# Patient Record
Sex: Female | Born: 1990 | Race: White | Hispanic: No | Smoking: Former smoker
Health system: Southern US, Community
[De-identification: ages and names within clinical notes are randomized; demographics above are authoritative.]

## PROBLEM LIST (undated history)

## (undated) DIAGNOSIS — O24419 Gestational diabetes mellitus in pregnancy, unspecified control: Secondary | ICD-10-CM

## (undated) HISTORY — PX: DILATION AND CURETTAGE OF UTERUS: SHX78

---
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
## 2008-03-27 ENCOUNTER — Emergency Department (HOSPITAL_COMMUNITY): Admission: EM | Admit: 2008-03-27 | Discharge: 2008-03-27 | Payer: Self-pay | Admitting: Emergency Medicine

## 2008-06-27 ENCOUNTER — Emergency Department (HOSPITAL_COMMUNITY): Admission: EM | Admit: 2008-06-27 | Discharge: 2008-06-27 | Payer: Self-pay | Admitting: Emergency Medicine

## 2008-06-29 ENCOUNTER — Emergency Department (HOSPITAL_COMMUNITY): Admission: EM | Admit: 2008-06-29 | Discharge: 2008-06-29 | Payer: Self-pay | Admitting: Emergency Medicine

## 2010-04-02 ENCOUNTER — Other Ambulatory Visit: Admission: RE | Admit: 2010-04-02 | Discharge: 2010-04-02 | Payer: Self-pay | Admitting: Obstetrics & Gynecology

## 2010-09-14 ENCOUNTER — Emergency Department (HOSPITAL_COMMUNITY): Admission: EM | Admit: 2010-09-14 | Discharge: 2010-09-14 | Payer: Self-pay | Admitting: Emergency Medicine

## 2011-05-16 NOTE — H&P (Signed)
Emily [name], Emily [name]               ACCOUNT NO.:  [id number]   MEDICAL RECORD NO.:  [id number]          PATIENT TYPE:  INP   LOCATION:  LDR1                          FACILITY:  [facility]   PHYSICIAN:  Lazaro Arms, M.D.   DATE OF BIRTH:  [birth year]   DATE OF ADMISSION:  01/23/2007  DATE OF DISCHARGE:  LH                              HISTORY & PHYSICAL   Nira is a 20 year old white female gravida 2, para 0, aborts 1.  Estimated date of delivery of February 06, 2007 by 7-week, 5 day  sonogram, currently at [redacted] weeks gestation presented to labor and  delivery complaining of regular uterine contractions.  She was found to  be having contractions every 4-6 minutes.  Her cervix is 1-2 cm,pretty  thick and minus 2.  We kept her here in observation.  Gave her 15 of  morphine IM as she was crying and in a great deal of pain.  Contractions  spaced out.  Not getting any closer together.  This morning, she is 2-3,  15, minus 2 with an amniotomy.  She is in early labor.  She will need  pitocin augmentation and also an epidural.  She has already had a couple  of dose of IV pain medicine.   PAST MEDICAL HISTORY:  Negative.   PAST SURGICAL HISTORY:  D&C in September, 2005 for pregnancy loss.   ALLERGIES:  None.   MEDICATIONS:  Prenatal vitamins.   REVIEW OF SYSTEMS:  Otherwise, negative.  She A positive, varicella  immune, rubella immune, antibody screen is negative, hepatitis B  negative, HIV non-reactive.  HSV2 is negative.  Serology is non-  reactive.  Pap smear showed ASCUS with HPV being indeterminate.  GC and  Chlamydia were negative x2, group B strep negative, Glucola was 143 but  I do not see her 3-hour GTT on the chart.  __________ was normal.   IMPRESSION:  1. Intrauterine pregnancy [redacted] weeks gestation.  2. Early labor.   PLAN:  The patient has undergone an amniotomy to augment her labor with  pitocin and place an epidural.      Lazaro Arms, M.D.  Electronically  Signed     LHE/MEDQ  D:  01/24/2007  T:  01/24/2007  Job:  [id number]

## 2011-05-16 NOTE — Group Therapy Note (Signed)
[name], [name]               ACCOUNT NO.:  [id number]   MEDICAL RECORD NO.:  [id number]          PATIENT TYPE:  OBV   LOCATION:  A415                          FACILITY:  [facility]   PHYSICIAN:  Lazaro Arms, M.D.   DATE OF BIRTH:  20-Mar-1991   DATE OF PROCEDURE:  DATE OF DISCHARGE:  01/08/2007                                 PROGRESS NOTE   PROGRESS NOTE   Emily [name] was in the birthing center 01/07/2007. She was admitted  for management of dehydration and vomiting.  She received several liters  of IV fluids and is going to be kept overnight.      Jacklyn Shell, C.N.M.      Lazaro Arms, M.D.  Electronically Signed    FC/MEDQ  D:  01/12/2007  T:  01/12/2007  Job:  [id number]

## 2011-05-16 NOTE — H&P (Signed)
NAME:  Emily [name], Emily [name]                         ACCOUNT NO.:  [id number]   MEDICAL RECORD NO.:  [id number]                   PATIENT TYPE:   LOCATION:                                       FACILITY:  [facility]   PHYSICIAN:  Lazaro Arms, M.D.                DATE OF BIRTH:  14-May-1991   DATE OF ADMISSION:  09/05/2004  DATE OF DISCHARGE:                                HISTORY & PHYSICAL   HISTORY OF PRESENT ILLNESS:  Emily [name] is a 20 year old white female, gravida  1, para 0, last menstrual period unsure, but she came in the office and had  a positive pregnancy test.  She had a vaginal ultrasound that revealed a  nonviable pregnancy at [redacted] weeks gestation.  The patient and her mother were  informed of this.  It was confirmed by independently from [place].  The  patient came back and wanted to have another confirmation.  I sent her to  the hospital and again they confirmed a 9-week gestation that was nonviable.  As a result, the patient and her mother have requested a D&C and she is  admitted for that.   PAST MEDICAL HISTORY:  Negative.   PAST SURGICAL HISTORY:  Negative.   PAST OBSTETRICAL HISTORY:  Nulliparous.   ALLERGIES:  None.   MEDICATIONS:  Multivitamins.   REVIEW OF SYSTEMS:  Otherwise negative.   PHYSICAL EXAMINATION:  HEENT:  Unremarkable.  NECK:  Thyroid normal.  LUNGS:  Clear.  HEART:  Regular rate and rhythm without murmur, rub or gallop.  BREASTS:  Without mass, discharge or skin changes.  ABDOMEN:  Benign.  PELVIC:  Normal.  EXTREMITIES:  Warm with no edema.   IMPRESSION:  Miss abortion in the first trimester.   PLAN:  The patient is admitted for cervical dilation and uterine curettage.  She understands the risks, benefits, indications and alternatives.  Will  proceed.    ___________________________________________                                         Lazaro Arms, M.D.   Loraine Maple  D:  09/04/2004  T:  09/04/2004  Job:  [id number]

## 2011-05-16 NOTE — Op Note (Signed)
[name], [name]               ACCOUNT NO.:  [id number]   MEDICAL RECORD NO.:  [id number]          PATIENT TYPE:  INP   LOCATION:  LDR1                          FACILITY:  [facility]   PHYSICIAN:  Lazaro Arms, M.D.   DATE OF BIRTH:  07/02/1991   DATE OF PROCEDURE:  01/24/2007  DATE OF DISCHARGE:                               OPERATIVE REPORT   PROCEDURE:  Epidural note.   HISTORY:  [name] is a 20 year old gravida 2, para 0, abortion 1-- [redacted]  weeks gestation in early labor, who has had a couple doses of IV pain  medicine and is requesting an epidural to be placed.   DESCRIPTION OF PROCEDURE:  The Patient is placed in the sitting  position.  Betadine prep was used; 1% lidocaine was injected in the L3-  L4 interspace.  The area was then field draped, and a 17-gauge Tuohy  needle was used; loss of resistance technique employed.  The epidural  space was found with one pass without difficulty.  Then 10 cc of 0.125%  bupivacaine plain was given as a test dose without ill effects.  The  epidural catheter was then fed, but it feeds directly into a vessel.  I  cleared the blood.  I took the catheter out, and then threaded it once  again; this time it threaded fine into the epidural space and not into a  vessel.  I kept putting negative pressure to make sure no blood was  drawn, as I injected the dosing of the epidural of 10 cc of 0.125%  bupivacaine -- but no further blood loss was gotten back and the patient  was asymptomatic.  It was taped down 5 cm from the epidural space.  Continuous infusion of 0.125% bupivacaine plain with 2 mcg/cc of  fentanyl was begun at 12 cc/hr.  The patient is getting good pain  relief.  Blood pressure stable.  Fetal heart rate tracing stable.      Lazaro Arms, M.D.  Electronically Signed     LHE/MEDQ  D:  01/24/2007  T:  01/24/2007  Job:  [id number]

## 2011-05-16 NOTE — Discharge Summary (Signed)
[name], Emily [name]               ACCOUNT NO.:  [id number]   MEDICAL RECORD NO.:  [id number]          PATIENT TYPE:  OIB   LOCATION:  A415                          FACILITY:  [facility]   PHYSICIAN:  Tilda Burrow, M.D. DATE OF BIRTH:  [birth year]   DATE OF ADMISSION:  01/07/2007  DATE OF DISCHARGE:  LH                               DISCHARGE SUMMARY   OBSERVATION NOTE:  Observation times 16 hours.   The patient is a 20 year old female, gravida 2, para 0, AB 1, presents  complaining of abdominal and back discomfort with low-grade fever.  She  has been throwing up more than usual.  She describes herself as having  been vomiting the whole pregnancy, but it is worse in the last few  days.  There has been a virus going through the family in both her  sister and her step-dad, who are nauseated and vomiting at home with low-  grade temperatures.  She presents today with temperature of 99.1 with  laboratory evaluation minimal.  She has normal pulse and blood pressure.  Fetal heart rate is in the normal range.  There are no contractions.  She does complain of abdominal discomfort.  She is observed through the  night.   This a.m., abdominal exam is entirely normal with a gravid uterus, term-  sized fetus.  Cervix not checked at this time.  Normal at last visit,  early this week.   PLAN:  Plans are to test on full liquids this morning and discharge her  home if this is tolerated and give her prescriptions for Phenergan 25 mg  p.o. q. 6 hours p.r.n. nausea, as well as Vicodin for her chronic back  discomfort of pregnancy and follow up next week.   DISCHARGE DIAGNOSES:  1. Pregnancy, 36 weeks, not delivered.  2. Nausea and vomiting secondary to gastroenteritis, suspect viral.      Tilda Burrow, M.D.  Electronically Signed     JVF/MEDQ  D:  01/08/2007  T:  01/08/2007  Job:  [id number]   cc:   [facility]

## 2011-05-16 NOTE — Op Note (Signed)
NAMEJAYCELYNN, Emily [name]               ACCOUNT NO.:  [id number]   MEDICAL RECORD NO.:  [id number]          PATIENT TYPE:  INP   LOCATION:  LDR1                          FACILITY:  [facility]   PHYSICIAN:  Lazaro Arms, M.D.   DATE OF BIRTH:  1991-11-07   DATE OF PROCEDURE:  01/24/2007  DATE OF DISCHARGE:                                PROCEDURE NOTE   DELIVERY NOTE:  Emily [name] is an 20 year old gravida 2, para 0, abortus 1  at 83 weeks' gestation, who presented in latent-phase labor.  I ended up  doing an amniotomy and augmenting her labor and placing an epidural.  She had a slow progression through the latent phase and a pretty  appropriate progression through the first stage of labor.  She was found  to be complete at 2120 with a strong urge to push.  Fetal heart rate  tracing had been reassuring throughout.   Over an intact perineum was delivered a viable female infant at 2156 with  Apgars of 8 and 9, weighing 6 pounds 12.3 ounces.  There was a 3-vessel  cord.  Cord blood and cord gas sent.  The infant's arms were both  compound up next to the shoulders and as such, required a little extra  time to actually flipped the arms out and this was done without  difficulty.  The infant underwent routine neonatal resuscitation.  The  placenta was delivered spontaneously at time of 2210.  The uterus was  firm and 3 fingerbreadths below the umbilicus.  Estimated blood loss for  delivery was 250 mL.  The epidural catheter was removed, blue tip  intact, and she is going to undergo routine postpartum care.  The infant  will undergo routine neonatal care.      Lazaro Arms, M.D.  Electronically Signed     LHE/MEDQ  D:  01/24/2007  T:  01/25/2007  Job:  [id number]

## 2011-05-16 NOTE — H&P (Signed)
Emily [name], Emily [name]               ACCOUNT NO.:  [id number]   MEDICAL RECORD NO.:  [id number]          PATIENT TYPE:  INP   LOCATION:  A417                          FACILITY:  [facility]   PHYSICIAN:  Tilda Burrow, M.D. DATE OF BIRTH:  1991-04-29   DATE OF ADMISSION:  10/18/2006  DATE OF DISCHARGE:  LH                                HISTORY & PHYSICAL   ADMITTING DIAGNOSES:  Right pyelonephritis.   HPI:  This 20 year old, gravida 2, para 1, due February 8 placing her at 5  months' gestation is admitted via the emergency room after a 5-day history  of a mild right-sided pain progressing and with development of fever x24  hours.  She presented to the emergency room with a temperature 101, dropping  to 97.9 with fluid hydration.  She was found to have a positive urinalysis  with TNTC white cells and bacteria.  She has hemoglobin 10, hematocrit 29.6,  white count 12,900.  She is tolerating food, not throwing up at this time  but has right-sided pain not completely relieved with Lortab 5 every 6  hours.   PAST MEDICAL HISTORY:  Benign surgical history of D and C for miscarriage.   ALLERGIES:  None known.   Upon ER arrival, temperature 101.3, pulse 118, respirations 20; dropping to  97.9, pulse 81, respiration of 20, blood pressure 116/63.  Weight 125.  Height 5 feet 2 inches at admission at 9 a.m.  Dr. _eure__ was consulted  regarding admission last night.   PHYSICAL EXAMINATION:  Notable for 1-2+ right CVA tenderness.  Abdomen was  nontender without guarding or rebound.  Gravid uterus, 5 months' size.  However, review of her prenatal record from the office shows that she  started her pregnancy with a hemoglobin 5.6, hematocrit 37.7, blood type A-  positive.  Hepatitis, HIV, RPR, GC, and Chlamydia all negative.  Pap smear  showing ASCUS.   PLAN:  Continue Rocephin 1 gram IV every 12 hours.  Await culture and  sensitivity for brief hospital stay.      Tilda Burrow, M.D.  Electronically Signed     JVF/MEDQ  D:  10/19/2006  T:  10/19/2006  Job:  [id number]

## 2011-05-16 NOTE — Op Note (Signed)
NAME:  Emily [name], Emily [name]                         ACCOUNT NO.:  [id number]   MEDICAL RECORD NO.:  [id number]                   PATIENT TYPE:  AMB   LOCATION:  DAY                                  FACILITY:  [facility]   PHYSICIAN:  Lazaro Arms, M.D.                DATE OF BIRTH:  August 02, 1991   DATE OF PROCEDURE:  09/05/2004  DATE OF DISCHARGE:                                 OPERATIVE REPORT   PREOPERATIVE DIAGNOSIS:  Missed abortion at 9 weeks.   POSTOPERATIVE DIAGNOSIS:  Missed abortion at 9 weeks.   PROCEDURE:  Cervical dilation and sharp and suction uterine curettage.   SURGEON:  Dr. Despina Hidden.   ANESTHESIA:  1.  Laryngeal mask airway.  2.  Paracervical block.   FINDINGS:  The patient had confirmed by three different sources missed AB at  9 weeks, and she requested to have a D&C performed. Her mother was in  attendance for all conversations and ultrasounds and signed the permit.   DESCRIPTION OF OPERATION:  The patient was taken to the operating room,  placed in supine position. Underwent laryngeal mask airway, placed in a  dorsi lithotomy position, prepped and draped. Bladder was drained. The  cervix was grasped. Paracervical block was placed. Using 1% lidocaine 10 cc  on each side, the cervix was dilated serially to allow passage of 8 curved  suction curet. Several passes were made. All tissue removed. Ron Agee sharp  curettage was performed and confirmed there was good uterine cry in all  areas, and one more pass with the suction curet, and all tissue had been  removed. The patient was awakened from anesthesia, taken to recovery room in  good and stable condition. All counts were correct. She received Ancef and  Toradol prophylactically, and she received Methergine intraoperatively.      ___________________________________________                                            Lazaro Arms, M.D.   Loraine Maple  D:  09/05/2004  T:  09/05/2004  Job:  [id number]

## 2011-05-16 NOTE — Discharge Summary (Signed)
NAMESHANTAL, Emily [name]               ACCOUNT NO.:  [id number]   MEDICAL RECORD NO.:  [id number]          PATIENT TYPE:  INP   LOCATION:  A417                          FACILITY:  [facility]   PHYSICIAN:  Tilda Burrow, M.D. DATE OF BIRTH:  November 16, 1991   DATE OF ADMISSION:  10/18/2006  DATE OF DISCHARGE:  10/25/2007LH                                 DISCHARGE SUMMARY   ADMITTING DIAGNOSES:  Right pyelonephritis.   DISCHARGE DIAGNOSES:  Right pyelonephritis secondary to E. coli; sensitive  to cephalosporin.   HOSPITAL SUMMARY:  This 20 year old gravida 2 para 1 at 5 months gestation  was admitted after presenting with a several-day history of low-grade  discomfort, temperature to 101 upon admission to the emergency room  reported.  Urinalysis positive; white count 12,900.  See HPI for details.   HOSPITAL COURSE:  The patient was admitted and placed on Rocephin 1 gram IV.  She had a slow response to antibiotics.  She remained low-grade temperature  at 99 range her first postoperative day; 100.1 the second postoperative day.  She was switched to cephalosporin to increase the frequency administration.  Culture returned showing sensitivity to all antibiotics, including Rocephine  and Keflex.   She was afebrile on October 21, 2006 for 24 hr; felt subjectively better and  was discharged home on October 22, 2006.   DISCHARGE MEDICATIONS:  1. Keflex 500 mg t.i.d. x10 days.  2. Loritab 5/500 30 tablets one q.4 h. p.r.n. pain.  3. Phenergan 12.5 mg tablet; 6 tablets as needed for occasional nausea.      Tilda Burrow, M.D.  Electronically Signed     JVF/MEDQ  D:  10/22/2006  T:  10/23/2006  Job:  [id number]

## 2011-07-16 ENCOUNTER — Encounter: Payer: Self-pay | Admitting: *Deleted

## 2011-07-16 ENCOUNTER — Emergency Department (HOSPITAL_COMMUNITY)
Admission: EM | Admit: 2011-07-16 | Discharge: 2011-07-16 | Disposition: A | Discharge status: Home / Self Care | Payer: Self-pay | Attending: Emergency Medicine | Admitting: Emergency Medicine

## 2011-07-16 DIAGNOSIS — F17200 Nicotine dependence, unspecified, uncomplicated: Secondary | ICD-10-CM | POA: Insufficient documentation

## 2011-07-16 DIAGNOSIS — R05 Cough: Secondary | ICD-10-CM

## 2011-07-16 DIAGNOSIS — R070 Pain in throat: Secondary | ICD-10-CM | POA: Insufficient documentation

## 2011-07-16 DIAGNOSIS — R059 Cough, unspecified: Secondary | ICD-10-CM | POA: Insufficient documentation

## 2011-07-16 DIAGNOSIS — J069 Acute upper respiratory infection, unspecified: Secondary | ICD-10-CM | POA: Insufficient documentation

## 2011-07-16 DIAGNOSIS — R509 Fever, unspecified: Secondary | ICD-10-CM | POA: Insufficient documentation

## 2011-07-16 MED ORDER — GUAIFENESIN-CODEINE 100-10 MG/5ML PO SYRP: ORAL_SOLUTION | ORAL | Status: DC

## 2011-07-16 MED ORDER — AZITHROMYCIN 250 MG PO TABS: ORAL_TABLET | ORAL | Status: DC

## 2011-07-16 NOTE — ED Notes (Signed)
Pt c/o sore throat, cough and congestion x 1 week. Pt states that she is coughing up blood tinged yellow mucous.

## 2011-07-16 NOTE — ED Provider Notes (Signed)
History     Chief Complaint  Patient presents with  . URI   HPI Comments: Patient c/o occasionally productive cough with chills and fever intermittently for one week.  She also reports sore throat .  She denies neck pain, or stiffness, headaches, chest pain, vomiting, shortness of breath or abd pain  Patient is a 20 y.o. female presenting with URI. The history is provided by the patient and the spouse.  URI The primary symptoms include fever, sore throat and cough. Primary symptoms do not include fatigue, headaches, ear pain, swollen glands, wheezing, abdominal pain, nausea, vomiting or rash. The current episode started 6 to 7 days ago. This is a new problem. The problem has not changed since onset. The fever began 6 to 7 days ago. The fever has been gradually improving since its onset. The maximum temperature recorded prior to her arrival was unknown.  The sore throat began more than 2 days ago. The sore throat has been unchanged since its onset. The sore throat is mild in intensity. Pain radiation: none. The sore throat is not accompanied by trouble swallowing or drooling.  The cough began 6 to 7 days ago. The cough is new. The cough is productive. There is nondescript sputum produced.  The onset of the illness is associated with exposure to sick contacts. Symptoms associated with the illness include chills and congestion. The illness is not associated with facial pain, sinus pressure or rhinorrhea. The following treatments were addressed: Acetaminophen was ineffective. A decongestant was not tried. NSAIDs were not tried.    History reviewed. No pertinent past medical history.  History reviewed. No pertinent past surgical history.  History reviewed. No pertinent family history.  History  Substance Use Topics  . Smoking status: Current Everyday Smoker -- 1.0 packs/day    Types: Cigarettes  . Smokeless tobacco: Not on file  . Alcohol Use: Yes     occasionally liquor    OB History    Grav Para Term Preterm Abortions TAB SAB Ect Mult Living                  Review of Systems  Constitutional: Positive for fever and chills. Negative for fatigue and unexpected weight change.  HENT: Positive for congestion and sore throat. Negative for ear pain, rhinorrhea, drooling, trouble swallowing, neck pain, neck stiffness and sinus pressure.   Eyes: Negative for pain and itching.  Respiratory: Positive for cough. Negative for chest tightness, shortness of breath and wheezing.   Cardiovascular: Negative.   Gastrointestinal: Negative.  Negative for nausea, vomiting and abdominal pain.  Genitourinary: Negative for dysuria and flank pain.  Musculoskeletal: Negative for back pain.  Skin: Negative.  Negative for rash.  Neurological: Negative for weakness, numbness and headaches.  Hematological: Does not bruise/bleed easily.  Psychiatric/Behavioral: Negative.     Physical Exam  BP 126/74  Pulse 87  Temp(Src) 98.9 F (37.2 C) (Oral)  Resp 24  Ht 5\' 1"  (1.549 m)  Wt 125 lb (56.7 kg)  BMI 23.62 kg/m2  SpO2 98%  LMP 03/28/2011  Physical Exam  Constitutional: She is oriented to person, place, and time. She appears well-developed and well-nourished. No distress.  HENT:  Head: Normocephalic and atraumatic.  Right Ear: External ear normal.  Left Ear: External ear normal.  Eyes: EOM are normal. Pupils are equal, round, and reactive to light.  Neck: Normal range of motion. Neck supple. No tracheal deviation present.  Cardiovascular: Normal rate, regular rhythm and normal heart sounds.  Pulmonary/Chest: No respiratory distress. She has no wheezes. She has no rales. She exhibits no tenderness.       cough  Abdominal: Soft. There is no tenderness.  Musculoskeletal: Normal range of motion.  Lymphadenopathy:    She has no cervical adenopathy.  Neurological: She is alert and oriented to person, place, and time.  Skin: Skin is warm and dry.  Psychiatric: She has a normal mood and  affect.    ED Course  Procedures  MDM  Patient is alert, no meningeal signs, does not appear septic.  Vitals are stable.     Medical screening examination/treatment/procedure(s) were performed by non-physician practitioner and as supervising physician I was immediately available for consultation/collaboration.    Tammy L. [place], [place] 07/16/11 1515  Sunnie Nielsen, MD 07/19/11 [phone]

## 2011-07-16 NOTE — ED Notes (Signed)
Cold symptoms x 1 week,

## 2011-09-25 LAB — STREP A DNA PROBE

## 2011-09-25 LAB — RAPID STREP SCREEN (MED CTR MEBANE ONLY): Streptococcus, Group A Screen (Direct): NEGATIVE

## 2012-04-27 ENCOUNTER — Encounter (HOSPITAL_COMMUNITY): Payer: Self-pay

## 2012-04-27 ENCOUNTER — Emergency Department (HOSPITAL_COMMUNITY)
Admission: EM | Admit: 2012-04-27 | Discharge: 2012-04-27 | Disposition: A | Discharge status: Home / Self Care | Payer: Self-pay | Attending: Emergency Medicine | Admitting: Emergency Medicine

## 2012-04-27 DIAGNOSIS — R51 Headache: Secondary | ICD-10-CM | POA: Insufficient documentation

## 2012-04-27 DIAGNOSIS — R110 Nausea: Secondary | ICD-10-CM | POA: Insufficient documentation

## 2012-04-27 MED ORDER — TRAMADOL HCL 50 MG PO TABS: 50.0000 mg | ORAL_TABLET | Freq: Four times a day (QID) | ORAL | Status: AC | PRN

## 2012-04-27 MED ORDER — KETOROLAC TROMETHAMINE 30 MG/ML IJ SOLN
15.0000 mg | Freq: Once | INTRAMUSCULAR | Status: AC
  Administered 2012-04-27: 15 mg via INTRAVENOUS
  Filled 2012-04-27: qty 1

## 2012-04-27 MED ORDER — SODIUM CHLORIDE 0.9 % IV BOLUS (SEPSIS)
1000.0000 mL | Freq: Once | INTRAVENOUS | Status: AC
  Administered 2012-04-27: 1000 mL via INTRAVENOUS

## 2012-04-27 MED ORDER — METOCLOPRAMIDE HCL 5 MG/ML IJ SOLN
10.0000 mg | Freq: Once | INTRAMUSCULAR | Status: AC
  Administered 2012-04-27: 10 mg via INTRAVENOUS
  Filled 2012-04-27: qty 2

## 2012-04-27 MED ORDER — DIPHENHYDRAMINE HCL 50 MG/ML IJ SOLN
25.0000 mg | Freq: Once | INTRAMUSCULAR | Status: AC
  Administered 2012-04-27: 25 mg via INTRAVENOUS
  Filled 2012-04-27: qty 1

## 2012-04-27 NOTE — ED Notes (Signed)
Patient with no complaints at this time. Respirations even and unlabored. Skin warm/dry. Discharge instructions reviewed with patient at this time. Patient given opportunity to voice concerns/ask questions. IV removed per policy and band-aid applied to site. Patient discharged at this time and left Emergency Department with steady gait.  

## 2012-04-27 NOTE — ED Provider Notes (Signed)
History    21 year old female with headache. Gradual onset about 3 days ago. Denies,. Pain is relatively constant. Diffuse. Does not lateralize. Pain seems to be worse in the morning some slight improvement throughout the day. Nausea today. No vomiting. No acute visual changes. No fevers or chills. No neck pain or neck stiffness. Denies use of blood thinning medication aside from the Excedrin Migraine that she has been taking with little relief. No numbness, tingling or loss of strength. Feels steady when she walks. No family history of cerebral aneurysm that she is aware of.   CSN: [id number]  Arrival date & time 04/27/12  1424   First MD Initiated Contact with Patient 04/27/12 1433      Chief Complaint  Patient presents with  . Headache    (Consider location/radiation/quality/duration/timing/severity/associated sxs/prior treatment) HPI  History reviewed. No pertinent past medical history.  History reviewed. No pertinent past surgical history.  No family history on file.  History  Substance Use Topics  . Smoking status: Current Everyday Smoker -- 1.0 packs/day    Types: Cigarettes  . Smokeless tobacco: Not on file  . Alcohol Use: No    OB History    Grav Para Term Preterm Abortions TAB SAB Ect Mult Living                  Review of Systems   Review of symptoms negative unless otherwise noted in HPI.   Allergies  Review of patient's allergies indicates no known allergies.  Home Medications   Current Outpatient Rx  Name Route Sig Dispense Refill  . ACETAMINOPHEN 500 MG PO TABS Oral Take 1,000 mg by mouth once as needed. For pain    . ASPIRIN-ACETAMINOPHEN-CAFFEINE 250-250-65 MG PO TABS Oral Take 2 tablets by mouth once as needed. For pain    . ASPIRIN-ACETAMINOPHEN-CAFFEINE 250-250-65 MG PO TABS Oral Take 2 tablets by mouth once as needed. For headache pain    . ETONOGESTREL 68 MG [place] IMPL Subcutaneous Inject 1 each into the skin once.      BP 124/62  Pulse 82   Temp(Src) 98.1 F (36.7 C) (Oral)  Resp 18  Ht 5\' 1"  (1.549 m)  Wt 106 lb (48.081 kg)  BMI 20.03 kg/m2  SpO2 99%  Physical Exam  Nursing note and vitals reviewed. Constitutional: She is oriented to person, place, and time. She appears well-developed and well-nourished. No distress.  HENT:  Head: Normocephalic and atraumatic.  Right Ear: External ear normal.  Left Ear: External ear normal.  Mouth/Throat: Oropharynx is clear and moist.       No external signs of trauma. Head and neck and face normal to inspection. No tenderness. No concerning skin changes.  Eyes: Conjunctivae and EOM are normal. Pupils are equal, round, and reactive to light. Right eye exhibits no discharge. Left eye exhibits no discharge.  Neck: Normal range of motion. Neck supple.  Cardiovascular: Normal rate, regular rhythm and normal heart sounds.  Exam reveals no gallop and no friction rub.   No murmur heard. Pulmonary/Chest: Effort normal and breath sounds normal. No respiratory distress.  Abdominal: Soft. She exhibits no distension. There is no tenderness.  Musculoskeletal: She exhibits no edema and no tenderness.  Lymphadenopathy:    She has no cervical adenopathy.  Neurological: She is alert and oriented to person, place, and time. No cranial nerve deficit. She exhibits normal muscle tone. Coordination normal.       Good finger to nose and heel to shin testing bilaterally. Good  rapid alternating finger movements.  Skin: Skin is warm and dry. She is not diaphoretic.  Psychiatric: She has a normal mood and affect. Her behavior is normal. Thought content normal.    ED Course  Procedures (including critical care time)  Labs Reviewed - No data to display No results found.   1. Headache       MDM  21 year old female with headache. Suspect primary HA. Consider emergent secondary causes such as bleed, infectious or mass but doubt. There is no history of trauma. Pt has a nonfocal neurological exam. Afebrile  and neck supple. No use of blood thinning medication. Consider ocular etiology such as acute angle closure glaucoma but doubt. Pt denies acute change in visual acuity and eye exam unremarkable.  Doubt CO poisoning. No contacts with similar symptoms. Doubt venous thrombosis. Doubt carotid or vertebral arteries dissection. Symptoms improved with meds. Feel that can be safely discharged, but strict return precautions discussed. Outpt fu.         Raeford Razor, MD 04/29/12 1436

## 2012-04-27 NOTE — ED Notes (Signed)
Headache x last 3 days, pain worse in the mornings, +nausea today.  "very shaky at times"

## 2012-04-27 NOTE — Discharge Instructions (Signed)
Headaches, Frequently Asked Questions MIGRAINE HEADACHES Q: What is migraine? What causes it? How can I treat it? A: Generally, migraine headaches begin as a dull ache. Then they develop into a constant, throbbing, and pulsating pain. You may experience pain at the temples. You may experience pain at the front or back of one or both sides of the head. The pain is usually accompanied by a combination of:  Nausea.   Vomiting.   Sensitivity to light and noise.  Some people (about 15%) experience an aura (see below) before an attack. The cause of migraine is believed to be chemical reactions in the brain. Treatment for migraine may include over-the-counter or prescription medications. It may also include self-help techniques. These include relaxation training and biofeedback.  Q: What is an aura? A: About 15% of people with migraine get an "aura". This is a sign of neurological symptoms that occur before a migraine headache. You may see wavy or jagged lines, dots, or flashing lights. You might experience tunnel vision or blind spots in one or both eyes. The aura can include visual or auditory hallucinations (something imagined). It may include disruptions in smell (such as strange odors), taste or touch. Other symptoms include:  Numbness.   A "pins and needles" sensation.   Difficulty in recalling or speaking the correct word.  These neurological events may last as long as 60 minutes. These symptoms will fade as the headache begins. Q: What is a trigger? A: Certain physical or environmental factors can lead to or "trigger" a migraine. These include:  Foods.   Hormonal changes.   Weather.   Stress.  It is important to remember that triggers are different for everyone. To help prevent migraine attacks, you need to figure out which triggers affect you. Keep a headache diary. This is a good way to track triggers. The diary will help you talk to your healthcare professional about your  condition. Q: Does weather affect migraines? A: Bright sunshine, hot, humid conditions, and drastic changes in barometric pressure may lead to, or "trigger," a migraine attack in some people. But studies have Revuelta that weather does not act as a trigger for everyone with migraines. Q: What is the link between migraine and hormones? A: Hormones start and regulate many of your body's functions. Hormones keep your body in balance within a constantly changing environment. The levels of hormones in your body are unbalanced at times. Examples are during menstruation, pregnancy, or menopause. That can lead to a migraine attack. In fact, about three quarters of all women with migraine report that their attacks are related to the menstrual cycle.  Q: Is there an increased risk of stroke for migraine sufferers? A: The likelihood of a migraine attack causing a stroke is very remote. That is not to say that migraine sufferers cannot have a stroke associated with their migraines. In persons under age 40, the most common associated factor for stroke is migraine headache. But over the course of a person's normal life span, the occurrence of migraine headache may actually be associated with a reduced risk of dying from cerebrovascular disease due to stroke.  Q: What are acute medications for migraine? A: Acute medications are used to treat the pain of the headache after it has started. Examples over-the-counter medications, NSAIDs, ergots, and triptans.  Q: What are the triptans? A: Triptans are the newest class of abortive medications. They are specifically targeted to treat migraine. Triptans are vasoconstrictors. They moderate some chemical reactions in the brain.   The triptans work on receptors in your brain. Triptans help to restore the balance of a neurotransmitter called serotonin. Fluctuations in levels of serotonin are thought to be a main cause of migraine.  Q: Are over-the-counter medications for migraine  effective? A: Over-the-counter, or "OTC," medications may be effective in relieving mild to moderate pain and associated symptoms of migraine. But you should see your caregiver before beginning any treatment regimen for migraine.  Q: What are preventive medications for migraine? A: Preventive medications for migraine are sometimes referred to as "prophylactic" treatments. They are used to reduce the frequency, severity, and length of migraine attacks. Examples of preventive medications include antiepileptic medications, antidepressants, beta-blockers, calcium channel blockers, and NSAIDs (nonsteroidal anti-inflammatory drugs). Q: Why are anticonvulsants used to treat migraine? A: During the past few years, there has been an increased interest in antiepileptic drugs for the prevention of migraine. They are sometimes referred to as "anticonvulsants". Both epilepsy and migraine may be caused by similar reactions in the brain.  Q: Why are antidepressants used to treat migraine? A: Antidepressants are typically used to treat people with depression. They may reduce migraine frequency by regulating chemical levels, such as serotonin, in the brain.  Q: What alternative therapies are used to treat migraine? A: The term "alternative therapies" is often used to describe treatments considered outside the scope of conventional Western medicine. Examples of alternative therapy include acupuncture, acupressure, and yoga. Another common alternative treatment is herbal therapy. Some herbs are believed to relieve headache pain. Always discuss alternative therapies with your caregiver before proceeding. Some herbal products contain arsenic and other toxins. TENSION HEADACHES Q: What is a tension-type headache? What causes it? How can I treat it? A: Tension-type headaches occur randomly. They are often the result of temporary stress, anxiety, fatigue, or anger. Symptoms include soreness in your temples, a tightening  band-like sensation around your head (a "vice-like" ache). Symptoms can also include a pulling feeling, pressure sensations, and contracting head and neck muscles. The headache begins in your forehead, temples, or the back of your head and neck. Treatment for tension-type headache may include over-the-counter or prescription medications. Treatment may also include self-help techniques such as relaxation training and biofeedback. CLUSTER HEADACHES Q: What is a cluster headache? What causes it? How can I treat it? A: Cluster headache gets its name because the attacks come in groups. The pain arrives with little, if any, warning. It is usually on one side of the head. A tearing or bloodshot eye and a runny nose on the same side of the headache may also accompany the pain. Cluster headaches are believed to be caused by chemical reactions in the brain. They have been described as the most severe and intense of any headache type. Treatment for cluster headache includes prescription medication and oxygen. SINUS HEADACHES Q: What is a sinus headache? What causes it? How can I treat it? A: When a cavity in the bones of the face and skull (a sinus) becomes inflamed, the inflammation will cause localized pain. This condition is usually the result of an allergic reaction, a tumor, or an infection. If your headache is caused by a sinus blockage, such as an infection, you will probably have a fever. An x-ray will confirm a sinus blockage. Your caregiver's treatment might include antibiotics for the infection, as well as antihistamines or decongestants.  REBOUND HEADACHES Q: What is a rebound headache? What causes it? How can I treat it? A: A pattern of taking acute headache medications too   often can lead to a condition known as "rebound headache." A pattern of taking too much headache medication includes taking it more than 2 days per week or in excessive amounts. That means more than the label or a caregiver advises.  With rebound headaches, your medications not only stop relieving pain, they actually begin to cause headaches. Doctors treat rebound headache by tapering the medication that is being overused. Sometimes your caregiver will gradually substitute a different type of treatment or medication. Stopping may be a challenge. Regularly overusing a medication increases the potential for serious side effects. Consult a caregiver if you regularly use headache medications more than 2 days per week or more than the label advises. ADDITIONAL QUESTIONS AND ANSWERS Q: What is biofeedback? A: Biofeedback is a self-help treatment. Biofeedback uses special equipment to monitor your body's involuntary physical responses. Biofeedback monitors:  Breathing.   Pulse.   Heart rate.   Temperature.   Muscle tension.   Brain activity.  Biofeedback helps you refine and perfect your relaxation exercises. You learn to control the physical responses that are related to stress. Once the technique has been mastered, you do not need the equipment any more. Q: Are headaches hereditary? A: Four out of five (80%) of people that suffer report a family history of migraine. Scientists are not sure if this is genetic or a family predisposition. Despite the uncertainty, a child has a 50% chance of having migraine if one parent suffers. The child has a 75% chance if both parents suffer.  Q: Can children get headaches? A: By the time they reach high school, most young people have experienced some type of headache. Many safe and effective approaches or medications can prevent a headache from occurring or stop it after it has begun.  Q: What type of doctor should I see to diagnose and treat my headache? A: Start with your primary caregiver. Discuss his or her experience and approach to headaches. Discuss methods of classification, diagnosis, and treatment. Your caregiver may decide to recommend you to a headache specialist, depending upon  your symptoms or other physical conditions. Having diabetes, allergies, etc., may require a more comprehensive and inclusive approach to your headache. The National Headache Foundation will provide, upon request, a list of NHF physician members in your state. Document Released: 03/06/2004 Document Revised: 12/04/2011 Document Reviewed: 08/14/2008 Exit[employer] Patient Information 2012 [employer].Headache, General, Unknown Cause The specific cause of your headache may not have been found today. There are many causes and types of headache. A few common ones are:  Tension headache.   Migraine.   Infections (examples: dental and sinus infections).   Bone and/or joint problems in the neck or jaw.   Depression.   Eye problems.  These headaches are not life threatening.  Headaches can sometimes be diagnosed by a patient history and a physical exam. Sometimes, lab and imaging studies (such as x-ray and/or CT scan) are used to rule out more serious problems. In some cases, a spinal tap (lumbar puncture) may be requested. There are many times when your exam and tests may be normal on the first visit even when there is a serious problem causing your headaches. Because of that, it is very important to follow up with your doctor or local clinic for further evaluation. FINDING OUT THE RESULTS OF TESTS  If a radiology test was performed, a radiologist will review your results.   You will be contacted by the emergency department or your physician if any test results   require a change in your treatment plan.   Not all test results may be available during your visit. If your test results are not back during the visit, make an appointment with your caregiver to find out the results. Do not assume everything is normal if you have not heard from your caregiver or the medical facility. It is important for you to follow up on all of your test results.  HOME CARE INSTRUCTIONS   Keep follow-up appointments with  your caregiver, or any specialist referral.   Only take over-the-counter or prescription medicines for pain, discomfort, or fever as directed by your caregiver.   Biofeedback, massage, or other relaxation techniques may be helpful.   Ice packs or heat applied to the head and neck can be used. Do this three to four times per day, or as needed.   Call your doctor if you have any questions or concerns.   If you smoke, you should quit.  SEEK MEDICAL CARE IF:   You develop problems with medications prescribed.   You do not respond to or obtain relief from medications.   You have a change from the usual headache.   You develop nausea or vomiting.  SEEK IMMEDIATE MEDICAL CARE IF:   If your headache becomes severe.   You have an unexplained oral temperature above 102 F (38.9 C), or as your caregiver suggests.   You have a stiff neck.   You have loss of vision.   You have muscular weakness.   You have loss of muscular control.   You develop severe symptoms different from your first symptoms.   You start losing your balance or have trouble walking.   You feel faint or pass out.  MAKE SURE YOU:   Understand these instructions.   Will watch your condition.   Will get help right away if you are not doing well or get worse.  Document Released: 12/15/2005 Document Revised: 12/04/2011 Document Reviewed: 08/03/2008 [facility] Patient Information 2012 [place], [place].  RESOURCE GUIDE  Dental Problems  Patients with Medicaid: [facility] [phone] [address]                                           [phone] [address] Phone:  [phone]                                                  Phone:  [phone]  If unable to pay or uninsured, contact:  [facility] or [facility]. to become qualified for the adult dental clinic.  Chronic Pain Problems Contact [facility]  [phone] Patients need to  be referred by their primary care doctor.  Insufficient Money for Medicine Contact [employer]:  call "211" or [facility] 928-722-6273.  No Primary Care Doctor Call [facility]  [phone] Other agencies that provide inexpensive medical care    [facility]  [phone]    [facility]  [phone]    [facility]  [phone]    [facility]  [phone]    Planned Parenthood  [phone]    [facility]  3867813906  Psychological Services [facility]  [phone] [facility]  [phone] [facility]   [phone] (emergency services ([phone])  Substance Abuse Resources [facility]  870-319-7527 [facility] [phone] [facility] [facility] [phone] Floydene Flock [phone] Residential & Outpatient Substance Abuse Program  4843308108  Abuse/Neglect [facility] Child Abuse Hotline 971-793-3547 [facility] Child Abuse Hotline 435-039-4465 (After Hours)  Emergency Shelter [facility] ([phone]  Maternity Homes [facility] [place] [facility] [phone] [facility] Services [phone]  MRSA Hotline #:   309-533-7951    [facility] Resources  [facility]     [employer]                          [facility] Dept. [address] [place]                       [address]      [address]  Blondell Reveal Phone:  [phone]                                   Phone:  [phone]                 Phone:  [phone]  [facility] Mental [facility] Phone:  [phone]  [facility] Child Abuse Hotline (901)883-5676 [phone] (After Hours)

## 2012-11-03 ENCOUNTER — Emergency Department (HOSPITAL_COMMUNITY)
Admission: EM | Admit: 2012-11-03 | Discharge: 2012-11-03 | Disposition: A | Discharge status: Home / Self Care | Payer: Self-pay | Attending: Emergency Medicine | Admitting: Emergency Medicine

## 2012-11-03 ENCOUNTER — Encounter (HOSPITAL_COMMUNITY): Payer: Self-pay | Admitting: Emergency Medicine

## 2012-11-03 DIAGNOSIS — Z7251 High risk heterosexual behavior: Secondary | ICD-10-CM | POA: Insufficient documentation

## 2012-11-03 DIAGNOSIS — Z7982 Long term (current) use of aspirin: Secondary | ICD-10-CM | POA: Insufficient documentation

## 2012-11-03 DIAGNOSIS — R109 Unspecified abdominal pain: Secondary | ICD-10-CM | POA: Insufficient documentation

## 2012-11-03 DIAGNOSIS — F17200 Nicotine dependence, unspecified, uncomplicated: Secondary | ICD-10-CM | POA: Insufficient documentation

## 2012-11-03 DIAGNOSIS — Z79899 Other long term (current) drug therapy: Secondary | ICD-10-CM | POA: Insufficient documentation

## 2012-11-03 DIAGNOSIS — N390 Urinary tract infection, site not specified: Secondary | ICD-10-CM | POA: Insufficient documentation

## 2012-11-03 LAB — URINE MICROSCOPIC-ADD ON

## 2012-11-03 LAB — POCT PREGNANCY, URINE: Preg Test, Ur: NEGATIVE

## 2012-11-03 LAB — URINALYSIS, ROUTINE W REFLEX MICROSCOPIC
Bilirubin Urine: NEGATIVE
Nitrite: POSITIVE — AB
Specific Gravity, Urine: >1.03 — ABNORMAL HIGH (ref 1.005–1.030)
Urobilinogen, UA: 0.2 mg/dL (ref 0.0–1.0)

## 2012-11-03 LAB — WET PREP, GENITAL

## 2012-11-03 MED ORDER — CEPHALEXIN 500 MG PO CAPS: 500.0000 mg | ORAL_CAPSULE | Freq: Four times a day (QID) | ORAL | Status: DC

## 2012-11-03 MED ORDER — IBUPROFEN 800 MG PO TABS
800.0000 mg | ORAL_TABLET | Freq: Once | ORAL | Status: AC
  Administered 2012-11-03: 800 mg via ORAL
  Filled 2012-11-03: qty 1

## 2012-11-03 NOTE — ED Notes (Signed)
Pt c/o lower abd pain for the past few months.  Reports was told by her doctor that it could be her ovaries.  Pt reports she recently changed sexual partners and for the past 3 weeks has had increased pelvic pain and thick, yellow, vaginal discharge.

## 2012-11-03 NOTE — ED Notes (Signed)
Obtained urine sample, changed pt to a pelvic stretcher, and pelvic cart at bedside.

## 2012-11-03 NOTE — ED Notes (Signed)
Did urine pregnancy - negative

## 2012-11-03 NOTE — ED Notes (Signed)
Pt states having pain for past couple months. Yellow vaginal discharge and lower abd pain with lower back pain. Pt states has had unprotected sex with new sex partner.

## 2012-11-03 NOTE — ED Provider Notes (Signed)
History   This chart was scribed for Joya Gaskins, MD by Gerlean Ren. This patient was seen in room APA04/APA04 and the patient's care was started at 7:31AM.   CSN: [id number]  Arrival date & time 11/03/12  4098   First MD Initiated Contact with Patient 11/03/12 0703      Chief Complaint  Patient presents with  . Vaginal Discharge  . Abdominal Pain     The history is provided by the patient. No language interpreter was used.   Emily [name] is a 21 y.o. female who presents to the Emergency Department complaining of 2 months of lower abdominal pain with associated yellow vaginal discharge.  Pt reports a recent new sexual partner.  Pt reports minimal hematuria with onset of pain that has since resolved.  Pt denies fever, emesis, urinary symptoms, and unusual bowel movements.  Pt has no h/o chronic medical conditions.  Pt is a current everyday smoker but denies alcohol use.   PMH - none  History reviewed. No pertinent past surgical history.  History reviewed. No pertinent family history.  History  Substance Use Topics  . Smoking status: Current Every Day Smoker -- 1.0 packs/day    Types: Cigarettes  . Smokeless tobacco: Not on file  . Alcohol Use: No    No OB history provided.  Review of Systems  Constitutional: Negative for fever.  Gastrointestinal: Negative for vomiting, diarrhea, constipation and blood in stool.  Genitourinary: Positive for vaginal discharge. Negative for dysuria, urgency, frequency, hematuria, vaginal bleeding and difficulty urinating.  All other systems reviewed and are negative.    Allergies  Review of patient's allergies indicates no known allergies.  Home Medications   Current Outpatient Rx  Name  Route  Sig  Dispense  Refill  . ACETAMINOPHEN 500 MG PO TABS   Oral   Take 1,000 mg by mouth once as needed. For pain         . ASPIRIN-ACETAMINOPHEN-CAFFEINE 250-250-65 MG PO TABS   Oral   Take 2 tablets by mouth once as needed. For  pain         . ASPIRIN-ACETAMINOPHEN-CAFFEINE 250-250-65 MG PO TABS   Oral   Take 2 tablets by mouth once as needed. For headache pain         . ETONOGESTREL 68 MG [place] IMPL   Subcutaneous   Inject 1 each into the skin once.           BP 125/74  Pulse 75  Temp 98.3 F (36.8 C) (Oral)  Resp 20  Ht 5\' 1"  (1.549 m)  Wt 120 lb (54.432 kg)  BMI 22.67 kg/m2  SpO2 96%  Physical Exam CONSTITUTIONAL: Well developed/well nourished HEAD AND FACE: Normocephalic/atraumatic EYES: EOMI/PERRL ENMT: Mucous membranes moist NECK: supple no meningeal signs SPINE:entire spine nontender CV: S1/S2 noted, no murmurs/rubs/gallops noted LUNGS: Lungs are clear to auscultation bilaterally, no apparent distress ABDOMEN: soft, nontender, no rebound or guarding GU:no cva tenderness, no CMT, no adnexal tenderness, minimal vaginal discharge, no blood. Chaperone present NEURO: Pt is awake/alert, moves all extremitiesx4 EXTREMITIES: pulses normal, full ROM SKIN: warm, color normal PSYCH: no abnormalities of mood noted  ED Course  Procedures DIAGNOSTIC STUDIES: Oxygen Saturation is 96% on room air, adequate by my interpretation.    COORDINATION OF CARE: 7:38AM- Patient informed of clinical course, understands medical decision-making process, and agrees with plan.  Ordered GC/chlamydia probe amp, urinalysis, and urine culture.  Pt well appearing, will treat for uti.  Does not appear  to have cervicitis/PID on exam    Labs Reviewed  URINALYSIS, ROUTINE W REFLEX MICROSCOPIC     MDM  Nursing notes including past medical history and social history reviewed and considered in documentation Labs/vital reviewed and considered   I personally performed the services described in this documentation, which was scribed in my presence. The recorded information has been reviewed and considered.           Joya Gaskins, MD 11/03/12 1003

## 2012-11-04 ENCOUNTER — Telehealth (HOSPITAL_COMMUNITY): Payer: Self-pay | Admitting: *Deleted

## 2012-11-04 ENCOUNTER — Encounter (HOSPITAL_COMMUNITY): Payer: Self-pay | Admitting: *Deleted

## 2012-11-04 ENCOUNTER — Emergency Department (HOSPITAL_COMMUNITY)
Admission: EM | Admit: 2012-11-04 | Discharge: 2012-11-04 | Disposition: A | Discharge status: Home / Self Care | Payer: Self-pay | Attending: Emergency Medicine | Admitting: Emergency Medicine

## 2012-11-04 DIAGNOSIS — A5609 Other chlamydial infection of lower genitourinary tract: Secondary | ICD-10-CM | POA: Insufficient documentation

## 2012-11-04 DIAGNOSIS — F17200 Nicotine dependence, unspecified, uncomplicated: Secondary | ICD-10-CM | POA: Insufficient documentation

## 2012-11-04 LAB — GC/CHLAMYDIA PROBE AMP, GENITAL: Chlamydia, DNA Probe: POSITIVE — AB

## 2012-11-04 MED ORDER — ONDANSETRON HCL 4 MG PO TABS: 4.0000 mg | ORAL_TABLET | Freq: Three times a day (TID) | ORAL | Status: DC | PRN

## 2012-11-04 MED ORDER — AZITHROMYCIN 250 MG PO TABS
1000.0000 mg | ORAL_TABLET | Freq: Once | ORAL | Status: AC
  Administered 2012-11-04: 1000 mg via ORAL
  Filled 2012-11-04: qty 4

## 2012-11-04 MED ORDER — ONDANSETRON 4 MG PO TBDP
4.0000 mg | ORAL_TABLET | Freq: Once | ORAL | Status: AC
  Administered 2012-11-04: 4 mg via ORAL
  Filled 2012-11-04: qty 1

## 2012-11-04 MED ORDER — OXYCODONE-ACETAMINOPHEN 5-325 MG PO TABS: 2.0000 | ORAL_TABLET | ORAL | Status: DC | PRN

## 2012-11-04 NOTE — ED Notes (Signed)
Pt returns to be treated for positive chlamydia test results. Pt c/o lower abdominal pain, n/v. Denies fever. Pt is tearful about current diagnosis. Pt reassured.

## 2012-11-04 NOTE — ED Notes (Signed)
Seen here yesterday for abd pain, tx for uti and found out today she has clamydia And needs tx for that.

## 2012-11-04 NOTE — ED Provider Notes (Signed)
History     CSN: [id number]  Arrival date & time 11/04/12  1707   First MD Initiated Contact with Patient 11/04/12 1719      Chief Complaint  Patient presents with  . Abdominal Pain    (Consider location/radiation/quality/duration/timing/severity/associated sxs/prior treatment) HPI Pt with two months of lower abdominal pain seen in the ED yesterday with signs of UTI. She was started on Keflex and referred to Dr. Emelda Fear. Today she was called for positive chlamydia and returns for treatment. She reports continued pain, but no fevers or vomiting. Some nausea here. Has been taking the Keflex as prescribed.   History reviewed. No pertinent past medical history.  History reviewed. No pertinent past surgical history.  History reviewed. No pertinent family history.  History  Substance Use Topics  . Smoking status: Current Every Day Smoker -- 1.0 packs/day    Types: Cigarettes  . Smokeless tobacco: Not on file  . Alcohol Use: No    OB History    Grav Para Term Preterm Abortions TAB SAB Ect Mult Living                  Review of Systems All other systems reviewed and are negative except as noted in HPI.   Allergies  Review of patient's allergies indicates no known allergies.  Home Medications   Current Outpatient Rx  Name  Route  Sig  Dispense  Refill  . ACETAMINOPHEN 500 MG PO TABS   Oral   Take 1,000 mg by mouth once as needed. For pain         . ASPIRIN-ACETAMINOPHEN-CAFFEINE 250-250-65 MG PO TABS   Oral   Take 2 tablets by mouth once as needed. For pain         . ASPIRIN-ACETAMINOPHEN-CAFFEINE 250-250-65 MG PO TABS   Oral   Take 2 tablets by mouth once as needed. For headache pain         . CEPHALEXIN 500 MG PO CAPS   Oral   Take 1 capsule (500 mg total) by mouth 4 (four) times daily.   28 capsule   0   . ETONOGESTREL 68 MG [place] IMPL   Subcutaneous   Inject 1 each into the skin once.           BP 150/75  Pulse 88  Temp 98.3 F (36.8 C)  (Oral)  Resp 20  Ht 5\' 1"  (1.549 m)  Wt 120 lb (54.432 kg)  BMI 22.67 kg/m2  SpO2 100%  Physical Exam  Nursing note and vitals reviewed. Constitutional: She is oriented to person, place, and time. She appears well-developed and well-nourished.  HENT:  Head: Normocephalic and atraumatic.  Eyes: EOM are normal. Pupils are equal, round, and reactive to light.  Neck: Normal range of motion. Neck supple.  Cardiovascular: Normal rate, normal heart sounds and intact distal pulses.   Pulmonary/Chest: Effort normal and breath sounds normal.  Abdominal: Bowel sounds are normal. She exhibits no distension. There is tenderness (diffuse lower abdomen). There is no rebound and no guarding.  Genitourinary:       No CVA tenderness  Musculoskeletal: Normal range of motion. She exhibits no edema and no tenderness.  Neurological: She is alert and oriented to person, place, and time. She has normal strength. No cranial nerve deficit or sensory deficit.  Skin: Skin is warm and dry. No rash noted.  Psychiatric: She has a normal mood and affect.    ED Course  Procedures (including critical care time)  Labs  Reviewed - No data to display No results found.   No diagnosis found.    MDM  Will treat for Chlamydia. Urine culture not yet resulted. No signs or symptoms of pyelonephritis. Doubt PID given benign pelvic exam documented on visit yesterday.         Emily [name] B. Bernette Mayers, MD 11/04/12 1728

## 2012-11-05 LAB — URINE CULTURE

## 2012-11-06 ENCOUNTER — Telehealth (HOSPITAL_COMMUNITY): Payer: Self-pay | Admitting: Emergency Medicine

## 2013-02-14 ENCOUNTER — Encounter (HOSPITAL_COMMUNITY): Payer: Self-pay | Admitting: Emergency Medicine

## 2013-02-14 ENCOUNTER — Emergency Department (HOSPITAL_COMMUNITY)
Admission: EM | Admit: 2013-02-14 | Discharge: 2013-02-14 | Disposition: A | Discharge status: Home / Self Care | Payer: Self-pay | Attending: Emergency Medicine | Admitting: Emergency Medicine

## 2013-02-14 DIAGNOSIS — R109 Unspecified abdominal pain: Secondary | ICD-10-CM | POA: Insufficient documentation

## 2013-02-14 DIAGNOSIS — Z3202 Encounter for pregnancy test, result negative: Secondary | ICD-10-CM | POA: Insufficient documentation

## 2013-02-14 DIAGNOSIS — Z79899 Other long term (current) drug therapy: Secondary | ICD-10-CM | POA: Insufficient documentation

## 2013-02-14 DIAGNOSIS — K5289 Other specified noninfective gastroenteritis and colitis: Secondary | ICD-10-CM | POA: Insufficient documentation

## 2013-02-14 DIAGNOSIS — F17200 Nicotine dependence, unspecified, uncomplicated: Secondary | ICD-10-CM | POA: Insufficient documentation

## 2013-02-14 DIAGNOSIS — R197 Diarrhea, unspecified: Secondary | ICD-10-CM | POA: Insufficient documentation

## 2013-02-14 DIAGNOSIS — Z7982 Long term (current) use of aspirin: Secondary | ICD-10-CM | POA: Insufficient documentation

## 2013-02-14 DIAGNOSIS — R112 Nausea with vomiting, unspecified: Secondary | ICD-10-CM | POA: Insufficient documentation

## 2013-02-14 DIAGNOSIS — K529 Noninfective gastroenteritis and colitis, unspecified: Secondary | ICD-10-CM

## 2013-02-14 LAB — CBC WITH DIFFERENTIAL/PLATELET
Basophils Relative: 1 % (ref 0–1)
Hemoglobin: 13.7 g/dL (ref 12.0–15.0)
MCHC: 33.7 g/dL (ref 30.0–36.0)
Monocytes Relative: 8 % (ref 3–12)
Neutro Abs: 3.7 10*3/uL (ref 1.7–7.7)
Neutrophils Relative %: 54 % (ref 43–77)
Platelets: 143 10*3/uL — ABNORMAL LOW (ref 150–400)
RBC: 4.68 MIL/uL (ref 3.87–5.11)

## 2013-02-14 LAB — URINALYSIS, ROUTINE W REFLEX MICROSCOPIC
Leukocytes, UA: NEGATIVE
Nitrite: NEGATIVE
Specific Gravity, Urine: <1.005 — ABNORMAL LOW (ref 1.005–1.030)
Urobilinogen, UA: 0.2 mg/dL (ref 0.0–1.0)

## 2013-02-14 LAB — COMPREHENSIVE METABOLIC PANEL
ALT: 9 U/L (ref 0–35)
AST: 13 U/L (ref 0–37)
Albumin: 3.9 g/dL (ref 3.5–5.2)
Alkaline Phosphatase: 52 U/L (ref 39–117)
BUN: 11 mg/dL (ref 6–23)
Chloride: 105 mEq/L (ref 96–112)
Potassium: 3.6 mEq/L (ref 3.5–5.1)
Sodium: 140 mEq/L (ref 135–145)
Total Bilirubin: 0.2 mg/dL — ABNORMAL LOW (ref 0.3–1.2)
Total Protein: 6.8 g/dL (ref 6.0–8.3)

## 2013-02-14 LAB — PREGNANCY, URINE: Preg Test, Ur: NEGATIVE

## 2013-02-14 MED ORDER — SODIUM CHLORIDE 0.9 % IV BOLUS (SEPSIS)
1000.0000 mL | Freq: Once | INTRAVENOUS | Status: AC
  Administered 2013-02-14: 1000 mL via INTRAVENOUS

## 2013-02-14 MED ORDER — PROMETHAZINE HCL 25 MG PO TABS: 25.0000 mg | ORAL_TABLET | Freq: Four times a day (QID) | ORAL | Status: DC | PRN

## 2013-02-14 MED ORDER — ONDANSETRON HCL 4 MG/2ML IJ SOLN
4.0000 mg | Freq: Once | INTRAMUSCULAR | Status: AC
  Administered 2013-02-14: 4 mg via INTRAVENOUS
  Filled 2013-02-14: qty 2

## 2013-02-14 MED ORDER — PANTOPRAZOLE SODIUM 40 MG IV SOLR
40.0000 mg | Freq: Once | INTRAVENOUS | Status: AC
  Administered 2013-02-14: 40 mg via INTRAVENOUS
  Filled 2013-02-14: qty 40

## 2013-02-14 MED ORDER — RANITIDINE HCL 150 MG PO CAPS: 150.0000 mg | ORAL_CAPSULE | Freq: Two times a day (BID) | ORAL | Status: DC

## 2013-02-14 NOTE — ED Notes (Signed)
Patient with no complaints at this time. Respirations even and unlabored. Skin warm/dry. Discharge instructions reviewed with patient at this time. Patient given opportunity to voice concerns/ask questions. IV removed per policy and band-aid applied to site. Patient discharged at this time and left Emergency Department with steady gait.  

## 2013-02-14 NOTE — ED Notes (Signed)
Patient complaining of vomiting and diarrhea starting last night. Denies pain.

## 2013-02-14 NOTE — ED Provider Notes (Signed)
History     This chart was scribed for Benny Lennert, MD, MD by Smitty Pluck, ED Scribe. The patient was seen in room APA18/APA18 and the patient's care was started at 7:34 AM.   CSN: [id number]  Arrival date & time 02/14/13  1308      Chief Complaint  Patient presents with  . Emesis  . Diarrhea     Patient is a 22 y.o. female presenting with vomiting and diarrhea. The history is provided by the patient. No language interpreter was used.  Emesis Severity:  Moderate Duration:  1 day Timing:  Intermittent Quality:  Bright red blood Progression:  Unchanged Chronicity:  New Recent urination:  Normal Context: not post-tussive and not self-induced   Relieved by:  Nothing Worsened by:  Nothing tried Ineffective treatments:  None tried Associated symptoms: abdominal pain and diarrhea   Associated symptoms: no headaches   Diarrhea:    Severity:  Mild   Duration:  1 day   Timing:  Intermittent   Progression:  Unchanged Diarrhea Associated symptoms: abdominal pain and vomiting   Associated symptoms: no headaches    Emily [name] is a 22 y.o. female who presents to the Emergency Department complaining of intermittent, emesis onset 1 day ago at 9 PM. She states she had hematemesis after vomiting once last night. Pt reports that she had diarrhea 2x since onset of emesis. She had 1 episode of diarrhea today. She mentions having mild abdominal pain. She denies dysuria and any other pain. Pt denies taking any medications PTA.   History reviewed. No pertinent past medical history.  History reviewed. No pertinent past surgical history.  History reviewed. No pertinent family history.  History  Substance Use Topics  . Smoking status: Current Every Day Smoker -- 1.00 packs/day    Types: Cigarettes  . Smokeless tobacco: Not on file  . Alcohol Use: Yes     Comment: occasionally    OB History   Grav Para Term Preterm Abortions TAB SAB Ect Mult Living                  Review  of Systems  Constitutional: Negative for fatigue.  HENT: Negative for congestion, sinus pressure and ear discharge.   Eyes: Negative for discharge.  Respiratory: Negative for cough.   Cardiovascular: Negative for chest pain.  Gastrointestinal: Positive for nausea, vomiting, abdominal pain and diarrhea.  Genitourinary: Negative for frequency and hematuria.  Musculoskeletal: Negative for back pain.  Skin: Negative for rash.  Neurological: Negative for seizures and headaches.  Psychiatric/Behavioral: Negative for hallucinations.  All other systems reviewed and are negative.    Allergies  Review of patient's allergies indicates no known allergies.  Home Medications   Current Outpatient Rx  Name  Route  Sig  Dispense  Refill  . acetaminophen (TYLENOL) 500 MG tablet   Oral   Take 1,000 mg by mouth once as needed. For pain         . aspirin-acetaminophen-caffeine (EXCEDRIN MIGRAINE) 250-250-65 MG per tablet   Oral   Take 2 tablets by mouth once as needed. For pain         . aspirin-acetaminophen-caffeine (HEADACHE RELIEF) 250-250-65 MG per tablet   Oral   Take 2 tablets by mouth once as needed. For headache pain         . cephALEXin (KEFLEX) 500 MG capsule   Oral   Take 1 capsule (500 mg total) by mouth 4 (four) times daily.   28 capsule  0   . etonogestrel (IMPLANON) 68 MG IMPL implant   Subcutaneous   Inject 1 each into the skin once.         . ondansetron (ZOFRAN) 4 MG tablet   Oral   Take 1 tablet (4 mg total) by mouth every 8 (eight) hours as needed for nausea.   12 tablet   0   . oxyCODONE-acetaminophen (PERCOCET/ROXICET) 5-325 MG per tablet   Oral   Take 2 tablets by mouth every 4 (four) hours as needed for pain.   15 tablet   0     BP 118/60  Pulse 65  Temp(Src) 98.2 F (36.8 C) (Oral)  Resp 14  Ht 5\' 1"  (1.549 m)  Wt 120 lb (54.432 kg)  BMI 22.69 kg/m2  SpO2 97%  Physical Exam  Nursing note and vitals reviewed. Constitutional: She is  oriented to person, place, and time. She appears well-developed.  HENT:  Head: Normocephalic and atraumatic.  Eyes: Conjunctivae and EOM are normal. No scleral icterus.  Neck: Neck supple. No thyromegaly present.  Cardiovascular: Normal rate and regular rhythm.  Exam reveals no gallop and no friction rub.   No murmur heard. Pulmonary/Chest: No stridor. She has no wheezes. She has no rales. She exhibits no tenderness.  Abdominal: She exhibits no distension. There is tenderness (mild) in the epigastric area and suprapubic area. There is no rebound.  Musculoskeletal: Normal range of motion. She exhibits no edema.  Lymphadenopathy:    She has no cervical adenopathy.  Neurological: She is oriented to person, place, and time. Coordination normal.  Skin: No rash noted. No erythema.  Psychiatric: She has a normal mood and affect. Her behavior is normal.    ED Course  Procedures (including critical care time) DIAGNOSTIC STUDIES: Oxygen Saturation is 97% on room air, normal by my interpretation.    COORDINATION OF CARE: 7:39 AM Discussed ED treatment with pt and pt agrees.  7:39 AM Ordered:  Medications  pantoprazole (PROTONIX) injection 40 mg (40 mg Intravenous Given 02/14/13 0751)  ondansetron (ZOFRAN) injection 4 mg (4 mg Intravenous Given 02/14/13 0751)  sodium chloride 0.9 % bolus 1,000 mL (1,000 mLs Intravenous New Bag/Given 02/14/13 0751)   9:16 AM Recheck: Discussed lab results and treatment course with pt. Pt is feeling better. Pt is ready for discharge.      Labs Reviewed  CBC WITH DIFFERENTIAL - Abnormal; Notable for the following:    Platelets 143 (*)    All other components within normal limits  COMPREHENSIVE METABOLIC PANEL - Abnormal; Notable for the following:    Total Bilirubin 0.2 (*)    GFR calc non Af Amer 88 (*)    All other components within normal limits  URINALYSIS, ROUTINE W REFLEX MICROSCOPIC - Abnormal; Notable for the following:    Specific Gravity, Urine  <1.005 (*)    All other components within normal limits  PREGNANCY, URINE   No results found.   No diagnosis found.    MDM  Gastroenteritis. Pt improved with tx      The chart was scribed for me under my direct supervision.  I personally performed the history, physical, and medical decision making and all procedures in the evaluation of this patient.Benny Lennert, MD 02/14/13 [phone]

## 2014-04-24 ENCOUNTER — Emergency Department (HOSPITAL_COMMUNITY)
Admission: EM | Admit: 2014-04-24 | Discharge: 2014-04-24 | Disposition: A | Discharge status: Home / Self Care | Payer: Self-pay | Attending: Emergency Medicine | Admitting: Emergency Medicine

## 2014-04-24 ENCOUNTER — Encounter (HOSPITAL_COMMUNITY): Payer: Self-pay | Admitting: Emergency Medicine

## 2014-04-24 DIAGNOSIS — J069 Acute upper respiratory infection, unspecified: Secondary | ICD-10-CM | POA: Insufficient documentation

## 2014-04-24 DIAGNOSIS — Z79899 Other long term (current) drug therapy: Secondary | ICD-10-CM | POA: Insufficient documentation

## 2014-04-24 DIAGNOSIS — H5710 Ocular pain, unspecified eye: Secondary | ICD-10-CM | POA: Insufficient documentation

## 2014-04-24 DIAGNOSIS — H6693 Otitis media, unspecified, bilateral: Secondary | ICD-10-CM

## 2014-04-24 DIAGNOSIS — H6690 Otitis media, unspecified, unspecified ear: Secondary | ICD-10-CM | POA: Insufficient documentation

## 2014-04-24 DIAGNOSIS — R197 Diarrhea, unspecified: Secondary | ICD-10-CM | POA: Insufficient documentation

## 2014-04-24 DIAGNOSIS — F17200 Nicotine dependence, unspecified, uncomplicated: Secondary | ICD-10-CM | POA: Insufficient documentation

## 2014-04-24 MED ORDER — AZITHROMYCIN 250 MG PO TABS: 250.0000 mg | ORAL_TABLET | Freq: Every day | ORAL | Status: DC

## 2014-04-24 NOTE — Discharge Instructions (Signed)
Take antibiotic as directed. Would expect you to be improved in 2 days if not return for recheck. Return earlier for any new or worse symptoms. Also recommend Motrin 800 mg every 8 hours.

## 2014-04-24 NOTE — ED Notes (Signed)
Pt c/o sore throat and bilateral ear pain x3-4 days. Pt also reports pain with swallowing. Denies cough, fever.

## 2014-04-24 NOTE — ED Provider Notes (Signed)
CSN: [id number]     Arrival date & time 04/24/14  4098 History  This chart was scribed for Emily Jakes, MD by Tana Conch, ED Scribe. This patient was seen in room APA03/APA03 and the patient's care was started at 8:09 AM .    Chief Complaint  Patient presents with  . Otalgia     Patient is a 23 y.o. female presenting with ear pain. The history is provided by the patient. No language interpreter was used.  Otalgia Location:  Bilateral Quality:  Unable to specify Severity:  Moderate Onset quality:  Gradual Duration:  4 days Timing:  Sporadic Progression:  Worsening Chronicity:  Recurrent Relieved by:  Nothing Worsened by:  Nothing tried Ineffective treatments:  None tried Associated symptoms: congestion, cough, diarrhea, headaches and sore throat   Associated symptoms: no abdominal pain, no fever, no rash and no vomiting    HPI Comments: Emily [name] is a 23 y.o. female who presents to the Emergency Department complaining of bilateral ear pain, the pain is worse in the right and began Thursday evening. Pt reports associated congestion, cough, and sore throat. She reports h/o ear pain She denies fever.  She reprots that her son had similar symptoms Friday.   History reviewed. No pertinent past medical history. History reviewed. No pertinent past surgical history. No family history on file. History  Substance Use Topics  . Smoking status: Current Every Day Smoker -- 1.00 packs/day    Types: Cigarettes  . Smokeless tobacco: Not on file  . Alcohol Use: Yes     Comment: occasionally   OB History   Grav Para Term Preterm Abortions TAB SAB Ect Mult Living                 Review of Systems  Constitutional: Negative for fever.  HENT: Positive for congestion, ear pain and sore throat.   Eyes: Positive for pain (right>left).  Respiratory: Positive for cough. Negative for shortness of breath.   Gastrointestinal: Positive for diarrhea. Negative for nausea, vomiting  and abdominal pain.  Genitourinary: Negative for dysuria.  Musculoskeletal: Positive for myalgias. Negative for back pain.  Skin: Negative for rash.  Neurological: Positive for headaches.  Hematological: Does not bruise/bleed easily.  Psychiatric/Behavioral: Negative for confusion.      Allergies  Review of patient's allergies indicates no known allergies.  Home Medications   Prior to Admission medications   Medication Sig Start Date End Date Taking? Authorizing Provider  etonogestrel (IMPLANON) 68 MG IMPL implant Inject 1 each into the skin once.    Historical Provider, MD  promethazine (PHENERGAN) 25 MG tablet Take 1 tablet (25 mg total) by mouth every 6 (six) hours as needed for nausea. 02/14/13   Benny Lennert, MD  ranitidine (ZANTAC) 150 MG capsule Take 1 capsule (150 mg total) by mouth 2 (two) times daily. 02/14/13   Benny Lennert, MD   BP 129/70  Pulse 89  Temp(Src) 98.2 F (36.8 C) (Oral)  Resp 18  Ht 5\' 1"  (1.549 m)  Wt 140 lb (63.504 kg)  BMI 26.47 kg/m2  SpO2 99%  LMP 02/23/2014 Physical Exam  Nursing note and vitals reviewed. Constitutional: She is oriented to person, place, and time. She appears well-developed and well-nourished.  HENT:  Right Ear: Tympanic membrane is erythematous.  Left Ear: Tympanic membrane is erythematous (left>right) and bulging.  Mouth/Throat: Oropharyngeal exudate (exudate on tonsil and tonsil are big) present.  Cardiovascular: Normal rate, regular rhythm and normal heart sounds.  Pulmonary/Chest: Effort normal and breath sounds normal.  Abdominal: Soft. Bowel sounds are normal. There is no tenderness.  Musculoskeletal: Normal range of motion. She exhibits no edema.  Lymphadenopathy:    She has no cervical adenopathy.  Neurological: She is alert and oriented to person, place, and time. No cranial nerve deficit. Coordination normal.  Skin: Skin is warm. No rash noted.    ED Course  Procedures (including critical care  time)  DIAGNOSTIC STUDIES: Oxygen Saturation is 99% on RA, normal by my interpretation.    COORDINATION OF CARE:  8:15 AM-Discussed treatment plan which includes antibiotics with pt at bedside and pt agreed to plan.    Results for orders placed during the hospital encounter of 02/14/13  CBC WITH DIFFERENTIAL      Result Value Ref Range   WBC 6.9  4.0 - 10.5 K/uL   RBC 4.68  3.87 - 5.11 MIL/uL   Hemoglobin 13.7  12.0 - 15.0 g/dL   HCT 04.540.6  40.936.0 - 81.146.0 %   MCV 86.8  78.0 - 100.0 fL   MCH 29.3  26.0 - 34.0 pg   MCHC 33.7  30.0 - 36.0 g/dL   RDW 91.414.1  78.211.5 - 95.615.5 %   Platelets 143 (*) 150 - 400 K/uL   Neutrophils Relative % 54  43 - 77 %   Neutro Abs 3.7  1.7 - 7.7 K/uL   Lymphocytes Relative 32  12 - 46 %   Lymphs Abs 2.2  0.7 - 4.0 K/uL   Monocytes Relative 8  3 - 12 %   Monocytes Absolute 0.6  0.1 - 1.0 K/uL   Eosinophils Relative 5  0 - 5 %   Eosinophils Absolute 0.4  0.0 - 0.7 K/uL   Basophils Relative 1  0 - 1 %   Basophils Absolute 0.1  0.0 - 0.1 K/uL  COMPREHENSIVE METABOLIC PANEL      Result Value Ref Range   Sodium 140  135 - 145 mEq/L   Potassium 3.6  3.5 - 5.1 mEq/L   Chloride 105  96 - 112 mEq/L   CO2 26  19 - 32 mEq/L   Glucose, Bld 93  70 - 99 mg/dL   BUN 11  6 - 23 mg/dL   Creatinine, Ser 2.130.91  0.50 - 1.10 mg/dL   Calcium 9.2  8.4 - 08.610.5 mg/dL   Total Protein 6.8  6.0 - 8.3 g/dL   Albumin 3.9  3.5 - 5.2 g/dL   AST 13  0 - 37 U/L   ALT 9  0 - 35 U/L   Alkaline Phosphatase 52  39 - 117 U/L   Total Bilirubin 0.2 (*) 0.3 - 1.2 mg/dL   GFR calc non Af Amer 88 (*) >90 mL/min   GFR calc Af Amer >90  >90 mL/min  URINALYSIS, ROUTINE W REFLEX MICROSCOPIC      Result Value Ref Range   Color, Urine YELLOW  YELLOW   APPearance CLEAR  CLEAR   Specific Gravity, Urine <1.005 (*) 1.005 - 1.030   pH 5.5  5.0 - 8.0   Glucose, UA NEGATIVE  NEGATIVE mg/dL   Hgb urine dipstick NEGATIVE  NEGATIVE   Bilirubin Urine NEGATIVE  NEGATIVE   Ketones, ur NEGATIVE  NEGATIVE  mg/dL   Protein, ur NEGATIVE  NEGATIVE mg/dL   Urobilinogen, UA 0.2  0.0 - 1.0 mg/dL   Nitrite NEGATIVE  NEGATIVE   Leukocytes, UA NEGATIVE  NEGATIVE  PREGNANCY, URINE  Result Value Ref Range   Preg Test, Ur NEGATIVE  NEGATIVE   No results found.  Medications - No data to display     Labs Review Labs Reviewed - No data to display  Imaging Review No results found.   EKG Interpretation None      MDM   Final diagnoses:  Bilateral otitis media  URI (upper respiratory infection)   I personally performed the services described in this documentation, which was scribed in my presence. The recorded information has been reviewed and is accurate.   Findings consistent with bilateral otitis media. Also with the pharyngitis with some exudate. Will treat the otitis media with Zithromax Z-Pak. He'll also take care strep throat if present. Patient nontoxic no acute distress. Symptoms seem to be associated with an upper respiratory infection.    Emily JakesScott W. Derrell Milanes, MD 04/24/14 [phone]

## 2014-04-24 NOTE — ED Notes (Signed)
Pt c/o right ear and throat pain for 4 days.

## 2014-05-18 ENCOUNTER — Emergency Department (HOSPITAL_COMMUNITY)
Admission: EM | Admit: 2014-05-18 | Discharge: 2014-05-18 | Disposition: A | Discharge status: Home / Self Care | Payer: Self-pay | Attending: Emergency Medicine | Admitting: Emergency Medicine

## 2014-05-18 ENCOUNTER — Encounter (HOSPITAL_COMMUNITY): Payer: Self-pay | Admitting: Emergency Medicine

## 2014-05-18 DIAGNOSIS — F17200 Nicotine dependence, unspecified, uncomplicated: Secondary | ICD-10-CM | POA: Insufficient documentation

## 2014-05-18 DIAGNOSIS — Z792 Long term (current) use of antibiotics: Secondary | ICD-10-CM | POA: Insufficient documentation

## 2014-05-18 DIAGNOSIS — J029 Acute pharyngitis, unspecified: Secondary | ICD-10-CM | POA: Insufficient documentation

## 2014-05-18 DIAGNOSIS — H6690 Otitis media, unspecified, unspecified ear: Secondary | ICD-10-CM | POA: Insufficient documentation

## 2014-05-18 DIAGNOSIS — H6692 Otitis media, unspecified, left ear: Secondary | ICD-10-CM

## 2014-05-18 MED ORDER — AMOXICILLIN-POT CLAVULANATE 875-125 MG PO TABS: 1.0000 | ORAL_TABLET | Freq: Two times a day (BID) | ORAL | Status: DC

## 2014-05-18 MED ORDER — HYDROCODONE-ACETAMINOPHEN 5-325 MG PO TABS: ORAL_TABLET | ORAL | Status: DC

## 2014-05-18 MED ORDER — AMOXICILLIN-POT CLAVULANATE 875-125 MG PO TABS
1.0000 | ORAL_TABLET | Freq: Once | ORAL | Status: AC
  Administered 2014-05-18: 1 via ORAL
  Filled 2014-05-18: qty 1

## 2014-05-18 NOTE — Discharge Instructions (Signed)
Otitis Media, Adult °Otitis media is redness, soreness, and puffiness (swelling) in the space just behind your eardrum (middle ear). It may be caused by allergies or infection. It often happens along with a cold. °HOME CARE °· Take your medicine as told. Finish it even if you start to feel better. °· Only take over-the-counter or prescription medicines for pain, discomfort, or fever as told by your doctor. °· Follow up with your doctor as told. °GET HELP IF: °· You have otitis media only in one ear or bleeding from your nose or both. °· You notice a lump on your neck. °· You are not getting better in 3 5 days. °· You feel worse instead of better. °GET HELP RIGHT AWAY IF:  °· You have pain that is not helped with medicine. °· You have puffiness, redness, or pain around your ear. °· You get a stiff neck. °· You cannot move part of your face (paralysis). °· You notice that the bone behind your ear hurts when you touch it. °MAKE SURE YOU:  °· Understand these instructions. °· Will watch your condition. °· Will get help right away if you are not doing well or get worse. °Document Released: 06/02/2008 Document Revised: 08/17/2013 Document Reviewed: 07/12/2013 °[employer]® Patient Information ©2014 [employer]. ° °

## 2014-05-18 NOTE — ED Notes (Signed)
Lt ear pain, sore throat.  Seen here 4/27 and pt says no better.

## 2014-05-22 NOTE — ED Provider Notes (Signed)
CSN: [id number]     Arrival date & time 05/18/14  2105 History   First MD Initiated Contact with Patient 05/18/14 2204     Chief Complaint  Patient presents with  . Otalgia     (Consider location/radiation/quality/duration/timing/severity/associated sxs/prior Treatment) Patient is a 23 y.o. female presenting with ear pain. The history is provided by the patient.  Otalgia Location:  Left Behind ear:  No abnormality Quality:  Aching and throbbing Severity:  Moderate Onset quality:  Gradual Duration:  3 weeks Timing:  Constant Progression:  Worsening Chronicity:  Recurrent Context: not direct blow, not foreign body in ear, not loud noise and no water in ear   Relieved by:  Nothing Worsened by:  Nothing tried Ineffective treatments: antibiotics, ear drops. Associated symptoms: sore throat   Associated symptoms: no abdominal pain, no congestion, no cough, no diarrhea, no ear discharge, no fever, no headaches, no hearing loss, no neck pain, no rash, no rhinorrhea, no tinnitus and no vomiting   Sore throat:    Severity:  Mild   Onset quality:  Gradual   Duration:  1 week   Timing:  Constant   Progression:  Unchanged Risk factors: chronic ear infection     History reviewed. No pertinent past medical history. History reviewed. No pertinent past surgical history. History reviewed. No pertinent family history. History  Substance Use Topics  . Smoking status: Current Every Day Smoker -- 1.00 packs/day    Types: Cigarettes  . Smokeless tobacco: Not on file  . Alcohol Use: Yes     Comment: occasionally   OB History   Grav Para Term Preterm Abortions TAB SAB Ect Mult Living                 Review of Systems  Constitutional: Negative for fever, chills, activity change and appetite change.  HENT: Positive for ear pain and sore throat. Negative for congestion, ear discharge, facial swelling, hearing loss, rhinorrhea, tinnitus and trouble swallowing.   Eyes: Negative for visual  disturbance.  Respiratory: Negative for cough, shortness of breath, wheezing and stridor.   Gastrointestinal: Negative for nausea, vomiting, abdominal pain and diarrhea.  Musculoskeletal: Negative for neck pain and neck stiffness.  Skin: Negative.  Negative for rash.  Neurological: Negative for dizziness, speech difficulty, weakness, numbness and headaches.  Hematological: Negative for adenopathy.  Psychiatric/Behavioral: Negative for confusion.  All other systems reviewed and are negative.     Allergies  Review of patient's allergies indicates no known allergies.  Home Medications   Prior to Admission medications   Medication Sig Start Date End Date Taking? Authorizing Provider  ibuprofen (ADVIL,MOTRIN) 200 MG tablet Take 200 mg by mouth every 6 (six) hours as needed for fever, headache or mild pain.   Yes Historical Provider, MD  amoxicillin-clavulanate (AUGMENTIN) 875-125 MG per tablet Take 1 tablet by mouth 2 (two) times daily. For 10 days 05/18/14   Kendrick Haapala L. Lynnae Ludemann, PA-C  etonogestrel (IMPLANON) 68 MG IMPL implant Inject 1 each into the skin once.    Historical Provider, MD  HYDROcodone-acetaminophen (NORCO/VICODIN) 5-325 MG per tablet Take one-two tabs po q 4-6 hrs prn pain 05/18/14   Javonte Elenes L. Ajani Rineer, PA-C  HYDROcodone-acetaminophen (NORCO/VICODIN) 5-325 MG per tablet Take one-two tabs po q 4-6 hrs prn pain 05/18/14   Patric Vanpelt L. Jiovany Scheffel, PA-C   BP 122/69  Pulse 85  Temp(Src) 98.8 F (37.1 C) (Oral)  Resp 20  Ht 5\' 2"  (1.575 m)  Wt 130 lb (58.968 kg)  BMI 23.77  kg/m2  SpO2 99%  LMP 04/28/2014 Physical Exam  Nursing note and vitals reviewed. Constitutional: She is oriented to person, place, and time. She appears well-developed and well-nourished. No distress.  HENT:  Head: Normocephalic and atraumatic.  Mouth/Throat: Uvula is midline, oropharynx is clear and moist and mucous membranes are normal. No uvula swelling. No oropharyngeal exudate.  Erythema of the left TM.   Mild middle ear effusion present.  Mild bulging.  No drainage or edema of the ear canal, TM appears intact.    Neck: Normal range of motion, full passive range of motion without pain and phonation normal. Neck supple. No spinous process tenderness and no muscular tenderness present. No rigidity. Normal range of motion present. No Brudzinski's sign and no Kernig's sign noted.  Cardiovascular: Normal rate, regular rhythm, normal heart sounds and intact distal pulses.   No murmur heard. Pulmonary/Chest: Effort normal and breath sounds normal. No stridor. No respiratory distress.  Musculoskeletal: Normal range of motion.  Lymphadenopathy:    She has no cervical adenopathy.  Neurological: She is alert and oriented to person, place, and time. Coordination normal.  Skin: Skin is warm and dry. No rash noted.    ED Course  Procedures (including critical care time) Labs Review Labs Reviewed - No data to display  Imaging Review No results found.   EKG Interpretation None      MDM   Final diagnoses:  Otitis media of left ear    Previous ED chart reviewed.    Pt is well appearing.  Non-toxic.  Airway patent.  No PTA, uvula is midline and non-edematous.  Pt agrees to Augmentin , vicodin for pain and close ENT follow-up, referral info given.  She appears stable for d/c and agrees to plan.      Mitchell Epling L. Trisha Mangleriplett, PA-C 05/22/14 2223

## 2014-05-23 NOTE — ED Provider Notes (Signed)
Medical screening examination/treatment/procedure(s) were performed by non-physician practitioner and as supervising physician I was immediately available for consultation/collaboration.   EKG Interpretation None        Quention Mcneill L Jawan Chavarria, MD 05/23/14 1434 

## 2014-05-24 MED FILL — Hydrocodone-Acetaminophen Tab 5-325 MG: ORAL | Qty: 6 | Status: AC

## 2015-10-02 ENCOUNTER — Emergency Department (HOSPITAL_COMMUNITY)
Admission: EM | Admit: 2015-10-02 | Discharge: 2015-10-02 | Disposition: A | Discharge status: Home / Self Care | Payer: Self-pay | Attending: Emergency Medicine | Admitting: Emergency Medicine

## 2015-10-02 ENCOUNTER — Encounter (HOSPITAL_COMMUNITY): Payer: Self-pay | Admitting: Emergency Medicine

## 2015-10-02 DIAGNOSIS — R1010 Upper abdominal pain, unspecified: Secondary | ICD-10-CM | POA: Insufficient documentation

## 2015-10-02 DIAGNOSIS — R112 Nausea with vomiting, unspecified: Secondary | ICD-10-CM | POA: Insufficient documentation

## 2015-10-02 DIAGNOSIS — Z720 Tobacco use: Secondary | ICD-10-CM | POA: Insufficient documentation

## 2015-10-02 DIAGNOSIS — R197 Diarrhea, unspecified: Secondary | ICD-10-CM | POA: Insufficient documentation

## 2015-10-02 DIAGNOSIS — Z3202 Encounter for pregnancy test, result negative: Secondary | ICD-10-CM | POA: Insufficient documentation

## 2015-10-02 LAB — CBC WITH DIFFERENTIAL/PLATELET
BASOS ABS: 0 10*3/uL (ref 0.0–0.1)
BASOS PCT: 1 %
Eosinophils Absolute: 0.3 10*3/uL (ref 0.0–0.7)
Eosinophils Relative: 3 %
HEMATOCRIT: 41.9 % (ref 36.0–46.0)
Hemoglobin: 14.1 g/dL (ref 12.0–15.0)
Lymphocytes Relative: 26 %
Lymphs Abs: 2.2 10*3/uL (ref 0.7–4.0)
MCH: 29.6 pg (ref 26.0–34.0)
MCHC: 33.7 g/dL (ref 30.0–36.0)
MCV: 87.8 fL (ref 78.0–100.0)
MONO ABS: 0.7 10*3/uL (ref 0.1–1.0)
Monocytes Relative: 8 %
NEUTROS ABS: 5.4 10*3/uL (ref 1.7–7.7)
Neutrophils Relative %: 62 %
PLATELETS: 184 10*3/uL (ref 150–400)
RBC: 4.77 MIL/uL (ref 3.87–5.11)
RDW: 13.7 % (ref 11.5–15.5)
WBC: 8.6 10*3/uL (ref 4.0–10.5)

## 2015-10-02 LAB — COMPREHENSIVE METABOLIC PANEL
ALBUMIN: 4.1 g/dL (ref 3.5–5.0)
ALT: 15 U/L (ref 14–54)
AST: 16 U/L (ref 15–41)
Alkaline Phosphatase: 49 U/L (ref 38–126)
Anion gap: 6 (ref 5–15)
BILIRUBIN TOTAL: 0.4 mg/dL (ref 0.3–1.2)
BUN: 12 mg/dL (ref 6–20)
CHLORIDE: 110 mmol/L (ref 101–111)
CO2: 22 mmol/L (ref 22–32)
Calcium: 8.4 mg/dL — ABNORMAL LOW (ref 8.9–10.3)
Creatinine, Ser: 0.74 mg/dL (ref 0.44–1.00)
GFR calc Af Amer: >60 mL/min (ref >60)
GFR calc non Af Amer: >60 mL/min (ref >60)
GLUCOSE: 102 mg/dL — AB (ref 65–99)
POTASSIUM: 4 mmol/L (ref 3.5–5.1)
Sodium: 138 mmol/L (ref 135–145)
Total Protein: 6.9 g/dL (ref 6.5–8.1)

## 2015-10-02 LAB — URINALYSIS, ROUTINE W REFLEX MICROSCOPIC
BILIRUBIN URINE: NEGATIVE
GLUCOSE, UA: NEGATIVE mg/dL
Ketones, ur: NEGATIVE mg/dL
Leukocytes, UA: NEGATIVE
NITRITE: NEGATIVE
PH: 6 (ref 5.0–8.0)
Protein, ur: NEGATIVE mg/dL
SPECIFIC GRAVITY, URINE: 1.02 (ref 1.005–1.030)
Urobilinogen, UA: 0.2 mg/dL (ref 0.0–1.0)

## 2015-10-02 LAB — URINE MICROSCOPIC-ADD ON

## 2015-10-02 LAB — LIPASE, BLOOD: Lipase: 22 U/L (ref 22–51)

## 2015-10-02 LAB — PREGNANCY, URINE: Preg Test, Ur: NEGATIVE

## 2015-10-02 MED ORDER — SODIUM CHLORIDE 0.9 % IV SOLN
1000.0000 mL | Freq: Once | INTRAVENOUS | Status: AC
  Administered 2015-10-02: 1000 mL via INTRAVENOUS

## 2015-10-02 MED ORDER — ONDANSETRON 4 MG PO TBDP
4.0000 mg | ORAL_TABLET | Freq: Once | ORAL | Status: AC
Start: 2015-10-02 — End: 2015-10-02
  Administered 2015-10-02: 4 mg via ORAL
  Filled 2015-10-02: qty 1

## 2015-10-02 MED ORDER — ONDANSETRON 4 MG PO TBDP: ORAL_TABLET | ORAL | Status: DC

## 2015-10-02 NOTE — ED Notes (Signed)
MD at bedside doing ultrasound.

## 2015-10-02 NOTE — ED Notes (Signed)
MD at bedside. 

## 2015-10-02 NOTE — Discharge Instructions (Signed)
If you were given medicines take as directed.  If you are on coumadin or contraceptives realize their levels and effectiveness is altered by many different medicines.  If you have any reaction (rash, tongues swelling, other) to the medicines stop taking and see a physician.    If your blood pressure was elevated in the ER make sure you follow up for management with a primary doctor or return for chest pain, shortness of breath or stroke symptoms.  Please follow up as directed and return to the ER or see a physician for new or worsening symptoms.  Thank you. Filed Vitals:   10/02/15 0803 10/02/15 1000 10/02/15 1030  BP: 129/67 115/65 126/62  Pulse: 66 58 71  Temp: 98 F (36.7 C)    TempSrc: Oral    Resp: Height:  (1.6 m)    Weight: 145 lb (65.772 kg)    SpO2: 98% 98% 99%

## 2015-10-02 NOTE — ED Provider Notes (Signed)
CSN: [id number]     Arrival date & time 10/02/15  0757 History  By signing my name below, I, Marica Otter, attest that this documentation has been prepared under the direction and in the presence of Blane Ohara, MD. Electronically Signed: Marica Otter, ED Scribe. 10/02/2015. 8:21 AM.   Chief Complaint  Patient presents with  . Emesis   HPI PCP: No PCP Per Patient HPI Comments: Emily [name] is a 24 y.o. female who presents to the Emergency Department complaining of intermittent vomiting with associated diarrhea onset at 6pm last night. Pt reports her last meal consisted of tacos prepared at home-- pt denies any other family member having similar Sx. Pt denies fever, chills, recent travel, hematemesis, sick contact, Hx of chron's disease, Hx of any surgeries, Hx of ulcers, any urinary Sx (including dysuria), or any other Sx at this time.    History reviewed. No pertinent past medical history. History reviewed. No pertinent past surgical history. No family history on file. Social History  Substance Use Topics  . Smoking status: Current Every Day Smoker -- 1.00 packs/day    Types: Cigarettes  . Smokeless tobacco: None  . Alcohol Use: Yes     Comment: occasionally   OB History    No data available     Review of Systems  Constitutional: Negative for fever and chills.  Gastrointestinal: Positive for nausea, vomiting and diarrhea.  Genitourinary: Negative for dysuria.  All other systems reviewed and are negative.  Allergies  Review of patient's allergies indicates no known allergies.  Home Medications   Prior to Admission medications   Medication Sig Start Date End Date Taking? Authorizing Provider  amoxicillin-clavulanate (AUGMENTIN) 875-125 MG per tablet Take 1 tablet by mouth 2 (two) times daily. For 10 days Patient not taking: Reported on 10/02/2015 05/18/14   Tammy Triplett, PA-C  etonogestrel (IMPLANON) 68 MG IMPL implant Inject 1 each into the skin once.    Historical  Provider, MD  HYDROcodone-acetaminophen (NORCO/VICODIN) 5-325 MG per tablet Take one-two tabs po q 4-6 hrs prn pain Patient not taking: Reported on 10/02/2015 05/18/14   Tammy Triplett, PA-C  HYDROcodone-acetaminophen (NORCO/VICODIN) 5-325 MG per tablet Take one-two tabs po q 4-6 hrs prn pain Patient not taking: Reported on 10/02/2015 05/18/14   Tammy Triplett, PA-C  ibuprofen (ADVIL,MOTRIN) 200 MG tablet Take 200 mg by mouth every 6 (six) hours as needed for fever, headache or mild pain.    Historical Provider, MD  ondansetron (ZOFRAN ODT) 4 MG disintegrating tablet  ODT q4 hours prn nausea/vomit 10/02/15   Blane Ohara, MD   Triage Vitals: BP 129/67 mmHg  Pulse 66  Temp(Src) 98 F (36.7 C) (Oral)  Resp 18  Ht  (1.6 m)  Wt 145 lb (65.772 kg)  BMI 25.69 kg/m2  SpO2 98%  LMP 09/30/2015 Physical Exam  Constitutional: She is oriented to person, place, and time. She appears well-developed and well-nourished.  HENT:  Head: Normocephalic.  Mouth/Throat: Mucous membranes are dry.  Eyes: EOM are normal.  Neck: Normal range of motion.  Cardiovascular: Normal rate, regular rhythm and normal heart sounds.   Pulmonary/Chest: Effort normal and breath sounds normal.  Abdominal: Soft. She exhibits no distension. There is no tenderness.  No peritonitis  Musculoskeletal: Normal range of motion.  Neurological: She is alert and oriented to person, place, and time.  Skin: No rash noted.  Psychiatric: She has a normal mood and affect.  Nursing note and vitals reviewed.  ED Course  Procedures (including  critical care time)  EMERGENCY DEPARTMENT BILIARY ULTRASOUND INTERPRETATION "Study: Limited Abdominal Ultrasound of the gallbladder and common bile duct."  INDICATIONS: Abdominal pain, Nausea and Vomiting Indication: Multiple views of the gallbladder and common bile duct were obtained in real-time with a Multi-frequency probe." PERFORMED BY:  Myself IMAGES ARCHIVED?: Yes FINDINGS: Gallstones  absent, Gallbladder wall normal in thickness and Sonographic Murphy's sign absent LIMITATIONS: Bowel Gas INTERPRETATION: Normal  CPT Code [phone] (limited abdominal)    DIAGNOSTIC STUDIES: Oxygen Saturation is 98% on RA, nl by my interpretation.    COORDINATION OF CARE: 8:12 AM: Discussed treatment plan which includes meds with pt at bedside; patient verbalizes understanding and agrees with treatment plan. Pt advised to return to ED immediately if RUQ or RLQ tenderness develops.   Labs Review Labs Reviewed  URINALYSIS, ROUTINE W REFLEX MICROSCOPIC (NOT AT [facility]) - Abnormal; Notable for the following:    Color, Urine STRAW (*)    Hgb urine dipstick MODERATE (*)    All other components within normal limits  URINE MICROSCOPIC-ADD ON - Abnormal; Notable for the following:    Squamous Epithelial / LPF MANY (*)    Bacteria, UA MANY (*)    Casts HYALINE CASTS (*)    All other components within normal limits  COMPREHENSIVE METABOLIC PANEL - Abnormal; Notable for the following:    Glucose, Bld 102 (*)    Calcium 8.4 (*)    All other components within normal limits  URINE CULTURE  PREGNANCY, URINE  CBC WITH DIFFERENTIAL/PLATELET  LIPASE, BLOOD   I have personally reviewed and evaluated these lab results as part of my medical decision-making.   MDM   Final diagnoses:  Nausea vomiting and diarrhea  Pain of upper abdomen   Patient presents with recurrent vomiting diarrhea body aches concern for viral/toxin induced. Patient improved on reassessment tolerating oral however patient mild epigastric discomfort. Blood work reviewed no acute findings. Bedside ultrasound gallbladder unremarkable. Patient stable for outpatient follow. Results and differential diagnosis were discussed with the patient/parent/guardian. Xrays were independently reviewed by myself.  Close follow up outpatient was discussed, comfortable with the plan.   Medications  ondansetron (ZOFRAN-ODT) disintegrating  tablet 4 mg (4 mg Oral Given 10/02/15 0823)  0.9 %  sodium chloride infusion (0 mLs Intravenous Stopped 10/02/15 1040)    Filed Vitals:   10/02/15 0803 10/02/15 1000 10/02/15 1030  BP: 129/67 115/65 126/62  Pulse: 66 58 71  Temp: 98 F (36.7 C)    TempSrc: Oral    Resp: Height:  (1.6 m)    Weight: 145 lb (65.772 kg)    SpO2: 98% 98% 99%    Final diagnoses:  Nausea vomiting and diarrhea  Pain of upper abdomen      Blane Ohara, MD 10/02/15 1105

## 2015-10-02 NOTE — ED Notes (Signed)
Pt c/o of n/v, body aches, and "mucous-like" diarrhea since 1800 yesterday. Denies fever/chills.

## 2015-10-04 LAB — URINE CULTURE

## 2016-03-18 ENCOUNTER — Emergency Department (HOSPITAL_COMMUNITY)
Admission: EM | Admit: 2016-03-18 | Discharge: 2016-03-18 | Disposition: A | Discharge status: Home / Self Care | Payer: Self-pay | Attending: Emergency Medicine | Admitting: Emergency Medicine

## 2016-03-18 ENCOUNTER — Encounter (HOSPITAL_COMMUNITY): Payer: Self-pay

## 2016-03-18 DIAGNOSIS — F17210 Nicotine dependence, cigarettes, uncomplicated: Secondary | ICD-10-CM | POA: Insufficient documentation

## 2016-03-18 DIAGNOSIS — R112 Nausea with vomiting, unspecified: Secondary | ICD-10-CM | POA: Insufficient documentation

## 2016-03-18 LAB — COMPREHENSIVE METABOLIC PANEL
ALT: 23 U/L (ref 14–54)
AST: 23 U/L (ref 15–41)
Albumin: 5 g/dL (ref 3.5–5.0)
Alkaline Phosphatase: 58 U/L (ref 38–126)
Anion gap: 10 (ref 5–15)
BUN: 11 mg/dL (ref 6–20)
CHLORIDE: 108 mmol/L (ref 101–111)
CO2: 25 mmol/L (ref 22–32)
CREATININE: 0.9 mg/dL (ref 0.44–1.00)
Calcium: 9.2 mg/dL (ref 8.9–10.3)
Glucose, Bld: 106 mg/dL — ABNORMAL HIGH (ref 65–99)
POTASSIUM: 4.1 mmol/L (ref 3.5–5.1)
SODIUM: 143 mmol/L (ref 135–145)
Total Bilirubin: 0.7 mg/dL (ref 0.3–1.2)
Total Protein: 8.2 g/dL — ABNORMAL HIGH (ref 6.5–8.1)

## 2016-03-18 LAB — URINALYSIS, ROUTINE W REFLEX MICROSCOPIC
BILIRUBIN URINE: NEGATIVE
Glucose, UA: NEGATIVE mg/dL
Ketones, ur: NEGATIVE mg/dL
Leukocytes, UA: NEGATIVE
NITRITE: NEGATIVE
PH: 6.5 (ref 5.0–8.0)
Protein, ur: 30 mg/dL — AB
SPECIFIC GRAVITY, URINE: 1.015 (ref 1.005–1.030)

## 2016-03-18 LAB — CBC WITH DIFFERENTIAL/PLATELET
BASOS ABS: 0.1 10*3/uL (ref 0.0–0.1)
Basophils Relative: 0 %
EOS ABS: 0.2 10*3/uL (ref 0.0–0.7)
EOS PCT: 1 %
HCT: 47.5 % — ABNORMAL HIGH (ref 36.0–46.0)
Hemoglobin: 15.9 g/dL — ABNORMAL HIGH (ref 12.0–15.0)
Lymphocytes Relative: 11 %
Lymphs Abs: 1.7 10*3/uL (ref 0.7–4.0)
MCH: 29.1 pg (ref 26.0–34.0)
MCHC: 33.5 g/dL (ref 30.0–36.0)
MCV: 86.8 fL (ref 78.0–100.0)
MONO ABS: 0.8 10*3/uL (ref 0.1–1.0)
Monocytes Relative: 5 %
Neutro Abs: 12.8 10*3/uL — ABNORMAL HIGH (ref 1.7–7.7)
Neutrophils Relative %: 83 %
PLATELETS: 215 10*3/uL (ref 150–400)
RBC: 5.47 MIL/uL — AB (ref 3.87–5.11)
RDW: 13.6 % (ref 11.5–15.5)
WBC: 15.6 10*3/uL — AB (ref 4.0–10.5)

## 2016-03-18 LAB — PREGNANCY, URINE: PREG TEST UR: NEGATIVE

## 2016-03-18 LAB — URINE MICROSCOPIC-ADD ON

## 2016-03-18 LAB — LIPASE, BLOOD: Lipase: 25 U/L (ref 11–51)

## 2016-03-18 MED ORDER — SODIUM CHLORIDE 0.9 % IV SOLN
1000.0000 mL | Freq: Once | INTRAVENOUS | Status: AC
  Administered 2016-03-18: 1000 mL via INTRAVENOUS

## 2016-03-18 MED ORDER — ONDANSETRON HCL 4 MG/2ML IJ SOLN
4.0000 mg | Freq: Once | INTRAMUSCULAR | Status: AC
  Administered 2016-03-18: 4 mg via INTRAVENOUS
  Filled 2016-03-18: qty 2

## 2016-03-18 MED ORDER — ONDANSETRON 4 MG PO TBDP: ORAL_TABLET | ORAL | Status: DC

## 2016-03-18 MED ORDER — SODIUM CHLORIDE 0.9 % IV SOLN: 1000.0000 mL | INTRAVENOUS | Status: DC

## 2016-03-18 NOTE — ED Provider Notes (Addendum)
CSN: [id number]     Arrival date & time 03/18/16  1208 History   First MD Initiated Contact with Patient 03/18/16 1324     Chief Complaint  Patient presents with  . Emesis    HPI Pt has been having trouble with nausea and vomiting all day.  She was drinking alcohol last night but does not think it was that much.  Maybe 1/2 pint.  This morning she felt thirsty but has been vomiting ever since.  She also has been having diarrhea.  Maybe 15 episodes of vomiting and diarrhea.  Whenever she tried to drink anything she vomits.  No fever.  No pain.    History reviewed. No pertinent past medical history. History reviewed. No pertinent past surgical history. No family history on file. Social History  Substance Use Topics  . Smoking status: Current Every Day Smoker -- 1.00 packs/day    Types: Cigarettes  . Smokeless tobacco: None  . Alcohol Use: Yes     Comment: occasionally   OB History    No data available     Review of Systems  Constitutional: Negative for fever.  Respiratory: Negative for shortness of breath.   Cardiovascular: Negative for chest pain.  Genitourinary: Negative for dysuria, vaginal bleeding and menstrual problem.  All other systems reviewed and are negative.     Allergies  Review of patient's allergies indicates no known allergies.  Home Medications   Prior to Admission medications   Medication Sig Start Date End Date Taking? Authorizing Provider  etonogestrel (IMPLANON) 68 MG IMPL implant Inject 1 each into the skin once.    Historical Provider, MD  ibuprofen (ADVIL,MOTRIN) 200 MG tablet Take 200 mg by mouth every 6 (six) hours as needed for fever, headache or mild pain.    Historical Provider, MD  ondansetron (ZOFRAN ODT) 4 MG disintegrating tablet 4mg  ODT q4 hours prn nausea/vomit 03/18/16   Linwood DibblesJon Jeriko Kowalke, MD   BP 111/61 mmHg  Pulse 79  Temp(Src) 97.7 F (36.5 C) (Oral)  Resp 18  Ht 5\' 2"  (1.575 m)  Wt 72.576 kg  BMI 29.26 kg/m2  SpO2 100%  LMP  03/14/2016 Physical Exam  Constitutional: She appears well-developed and well-nourished. No distress.  HENT:  Head: Normocephalic and atraumatic.  Right Ear: External ear normal.  Left Ear: External ear normal.  Eyes: Conjunctivae are normal. Right eye exhibits no discharge. Left eye exhibits no discharge. No scleral icterus.  Neck: Neck supple. No tracheal deviation present.  Cardiovascular: Normal rate, regular rhythm and intact distal pulses.   Pulmonary/Chest: Effort normal and breath sounds normal. No stridor. No respiratory distress. She has no wheezes. She has no rales.  Abdominal: Soft. Bowel sounds are normal. She exhibits no distension. There is no tenderness. There is no rebound and no guarding.  Musculoskeletal: She exhibits no edema or tenderness.  Neurological: She is alert. She has normal strength. No cranial nerve deficit (no facial droop, extraocular movements intact, no slurred speech) or sensory deficit. She exhibits normal muscle tone. She displays no seizure activity. Coordination normal.  Skin: Skin is warm and dry. No rash noted.  Psychiatric: She has a normal mood and affect.  Nursing note and vitals reviewed.   ED Course  Procedures (including critical care time) Labs Review Labs Reviewed  CBC WITH DIFFERENTIAL/PLATELET - Abnormal; Notable for the following:    WBC 15.6 (*)    RBC 5.47 (*)    Hemoglobin 15.9 (*)    HCT 47.5 (*)  Neutro Abs 12.8 (*)    All other components within normal limits  COMPREHENSIVE METABOLIC PANEL - Abnormal; Notable for the following:    Glucose, Bld 106 (*)    Total Protein 8.2 (*)    All other components within normal limits  URINALYSIS, ROUTINE W REFLEX MICROSCOPIC (NOT AT [facility]) - Abnormal; Notable for the following:    Hgb urine dipstick LARGE (*)    Protein, ur 30 (*)    All other components within normal limits  URINE MICROSCOPIC-ADD ON - Abnormal; Notable for the following:    Squamous Epithelial / LPF TOO NUMEROUS TO  COUNT (*)    Bacteria, UA MANY (*)    All other components within normal limits  PREGNANCY, URINE  LIPASE, BLOOD  Doubt UTI: No sx, suspect contamination    MDM   Final diagnoses:  Non-intractable vomiting with nausea, vomiting of unspecified type    Labs are reassuring.  No hepatitis or pancreatitis associated with her alcohol consumption.  Suspect viral illness.  Will make sure she can tolerate oral fluids and dc home with po zofran.   Linwood Dibbles, MD 03/18/16 1507  Tolerated oral fluids.  Ready to go home.  Linwood Dibbles, MD 03/18/16 [phone]

## 2016-03-18 NOTE — Discharge Instructions (Signed)

## 2016-03-18 NOTE — ED Notes (Signed)
Pt c/o n/v/d that started this morning.  Denies any abd pain

## 2016-08-16 ENCOUNTER — Emergency Department (HOSPITAL_COMMUNITY): Payer: Self-pay

## 2016-08-16 ENCOUNTER — Emergency Department (HOSPITAL_COMMUNITY)
Admission: EM | Admit: 2016-08-16 | Discharge: 2016-08-16 | Disposition: A | Discharge status: Home / Self Care | Payer: Self-pay | Attending: Emergency Medicine | Admitting: Emergency Medicine

## 2016-08-16 ENCOUNTER — Encounter (HOSPITAL_COMMUNITY): Payer: Self-pay | Admitting: *Deleted

## 2016-08-16 DIAGNOSIS — R0789 Other chest pain: Secondary | ICD-10-CM | POA: Insufficient documentation

## 2016-08-16 DIAGNOSIS — F17210 Nicotine dependence, cigarettes, uncomplicated: Secondary | ICD-10-CM | POA: Insufficient documentation

## 2016-08-16 DIAGNOSIS — Z79899 Other long term (current) drug therapy: Secondary | ICD-10-CM | POA: Insufficient documentation

## 2016-08-16 DIAGNOSIS — Z791 Long term (current) use of non-steroidal anti-inflammatories (NSAID): Secondary | ICD-10-CM | POA: Insufficient documentation

## 2016-08-16 LAB — CBC
HCT: 40.9 % (ref 36.0–46.0)
Hemoglobin: 13.6 g/dL (ref 12.0–15.0)
MCH: 29.1 pg (ref 26.0–34.0)
MCHC: 33.3 g/dL (ref 30.0–36.0)
MCV: 87.4 fL (ref 78.0–100.0)
Platelets: 191 10*3/uL (ref 150–400)
RBC: 4.68 MIL/uL (ref 3.87–5.11)
RDW: 13.8 % (ref 11.5–15.5)
WBC: 10.4 10*3/uL (ref 4.0–10.5)

## 2016-08-16 LAB — BASIC METABOLIC PANEL
Anion gap: 4 — ABNORMAL LOW (ref 5–15)
BUN: 14 mg/dL (ref 6–20)
CHLORIDE: 110 mmol/L (ref 101–111)
CO2: 23 mmol/L (ref 22–32)
CREATININE: 0.86 mg/dL (ref 0.44–1.00)
Calcium: 8.3 mg/dL — ABNORMAL LOW (ref 8.9–10.3)
GFR calc Af Amer: >60 mL/min (ref >60)
GFR calc non Af Amer: >60 mL/min (ref >60)
GLUCOSE: 97 mg/dL (ref 65–99)
POTASSIUM: 3.7 mmol/L (ref 3.5–5.1)
Sodium: 137 mmol/L (ref 135–145)

## 2016-08-16 LAB — TROPONIN I: Troponin I: <0.03 ng/mL (ref <0.03)

## 2016-08-16 LAB — D-DIMER, QUANTITATIVE (NOT AT ARMC): D DIMER QUANT: 0.47 ug{FEU}/mL (ref 0.00–0.50)

## 2016-08-16 MED ORDER — IBUPROFEN 400 MG PO TABS
400.0000 mg | ORAL_TABLET | Freq: Once | ORAL | Status: AC
  Administered 2016-08-16: 400 mg via ORAL
  Filled 2016-08-16: qty 1

## 2016-08-16 MED ORDER — ALBUTEROL SULFATE HFA 108 (90 BASE) MCG/ACT IN AERS: 2.0000 | INHALATION_SPRAY | RESPIRATORY_TRACT | 0 refills | Status: DC | PRN

## 2016-08-16 MED ORDER — HYDROCODONE-ACETAMINOPHEN 5-325 MG PO TABS
1.0000 | ORAL_TABLET | Freq: Once | ORAL | Status: AC
  Administered 2016-08-16: 1 via ORAL
  Filled 2016-08-16: qty 1

## 2016-08-16 MED ORDER — IPRATROPIUM-ALBUTEROL 0.5-2.5 (3) MG/3ML IN SOLN
3.0000 mL | Freq: Once | RESPIRATORY_TRACT | Status: AC
  Administered 2016-08-16: 3 mL via RESPIRATORY_TRACT
  Filled 2016-08-16: qty 3

## 2016-08-16 MED ORDER — ALBUTEROL SULFATE (2.5 MG/3ML) 0.083% IN NEBU
2.5000 mg | INHALATION_SOLUTION | Freq: Once | RESPIRATORY_TRACT | Status: AC
  Administered 2016-08-16: 2.5 mg via RESPIRATORY_TRACT
  Filled 2016-08-16: qty 3

## 2016-08-16 MED ORDER — METHOCARBAMOL 500 MG PO TABS: 1000.0000 mg | ORAL_TABLET | Freq: Four times a day (QID) | ORAL | 0 refills | Status: DC | PRN

## 2016-08-16 MED ORDER — NAPROXEN 250 MG PO TABS: 250.0000 mg | ORAL_TABLET | Freq: Two times a day (BID) | ORAL | 0 refills | Status: DC | PRN

## 2016-08-16 NOTE — ED Triage Notes (Signed)
Pt comes in with central chest pain. Pt states she woke up with this pain around 0700. Pain worsens with inspiration. Denies any cough or congestion.

## 2016-08-16 NOTE — ED Provider Notes (Signed)
[facility]-EM[facility]Y DEPT Provider Note   CSN: [id number] Arrival date & time: 08/16/16  0740     History   Chief Complaint Chief Complaint  Patient presents with  . Chest Pain    HPI Emily MeyerJessica B [name] is a 25 y.o. female.  HPI  Pt was seen at 0750. Per pt, c/o unknown onset and persistence of constant upper chest "pain" that she noticed when she woke up this morning at 0700. Pt describes the pain as "sharp," worsens when she takes a deep breath in and changes body positions. Denies injury, no palpitations, no cough, no back pain, no abd pain, no N/V/D, no fevers, no rash.    History reviewed. No pertinent past medical history.  There are no active problems to display for this patient.   History reviewed. No pertinent surgical history.     Home Medications    Prior to Admission medications   Medication Sig Start Date End Date Taking? Authorizing Provider  etonogestrel (IMPLANON) 68 MG IMPL implant Inject 1 each into the skin once.    Historical Provider, MD  ibuprofen (ADVIL,MOTRIN) 200 MG tablet Take 200 mg by mouth every 6 (six) hours as needed for fever, headache or mild pain.    Historical Provider, MD  ondansetron (ZOFRAN ODT) 4 MG disintegrating tablet 4mg  ODT q4 hours prn nausea/vomit 03/18/16   Linwood DibblesJon Knapp, MD    Family History   Social History Social History  Substance Use Topics  . Smoking status: Current Every Day Smoker    Packs/day: 1.00    Types: Cigarettes  . Smokeless tobacco: Never Used  . Alcohol use Yes     Comment: occasionally     Allergies   Review of patient's allergies indicates no known allergies.   Review of Systems Review of Systems ROS: Statement: All systems negative except as marked or noted in the HPI; Constitutional: Negative for fever and chills. ; ; Eyes: Negative for eye pain, redness and discharge. ; ; ENMT: Negative for ear pain, hoarseness, nasal congestion, sinus pressure and sore throat. ; ; Cardiovascular: Negative for  palpitations, diaphoresis, dyspnea and peripheral edema. ; ; Respiratory: Negative for cough, wheezing and stridor. ; ; Gastrointestinal: Negative for nausea, vomiting, diarrhea, abdominal pain, blood in stool, hematemesis, jaundice and rectal bleeding. . ; ; Genitourinary: Negative for dysuria, flank pain and hematuria. ; ; Musculoskeletal: +chest wall pain. Negative for back pain and neck pain. Negative for swelling and trauma.; ; Skin: Negative for pruritus, rash, abrasions, blisters, bruising and skin lesion.; ; Neuro: Negative for headache, lightheadedness and neck stiffness. Negative for weakness, altered level of consciousness, altered mental status, extremity weakness, paresthesias, involuntary movement, seizure and syncope.      Physical Exam Updated Vital Signs Pulse 82   Resp 16   Ht 5\' 2"  (1.575 m)   Wt 165 lb (74.8 kg)   LMP 08/02/2016   SpO2 97%   BMI 30.18 kg/m   Physical Exam 0755: Physical examination:  Nursing notes reviewed; Vital signs and O2 SAT reviewed;  Constitutional: Well developed, Well nourished, Well hydrated, In no acute distress; Head:  Normocephalic, atraumatic; Eyes: EOMI, PERRL, No scleral icterus; ENMT: Mouth and pharynx normal, Mucous membranes moist; Neck: Supple, Full range of motion, No lymphadenopathy; Cardiovascular: Regular rate and rhythm, No murmur, rub, or gallop; Respiratory: Breath sounds diminished & equal bilaterally, No wheezes.  Speaking full sentences with ease, Normal respiratory effort/excursion; Chest: +sternal and left upper parasternal areas tender to palp. No deformity, no soft tissue  crepitus, no rash. Movement normal; Abdomen: Soft, Nontender, Nondistended, Normal bowel sounds; Genitourinary: No CVA tenderness; Extremities: Pulses normal, No tenderness, No edema, No calf edema or asymmetry.; Neuro: AA&Ox3, Major CN grossly intact.  Speech clear. No gross focal motor or sensory deficits in extremities.; Skin: Color normal, Warm, Dry.   ED  Treatments / Results  Labs (all labs ordered are listed, but only abnormal results are displayed)   EKG  EKG Interpretation  Date/Time:  Saturday August 16 2016 07:49:55 EDT Ventricular Rate:  83 PR Interval:    QRS Duration: 109 QT Interval:  411 QTC Calculation: 483 R Axis:   110 Text Interpretation:  Sinus arrhythmia Borderline right axis deviation Borderline prolonged QT interval Baseline wander No old tracing to compare Confirmed by [facility] CenterMCMANUS  MD, Nicholos JohnsKATHLEEN [phone]([id number]) on 08/16/2016 8:01:12 AM       Radiology   Procedures Procedures (including critical care time)  Medications Ordered in ED Medications  HYDROcodone-acetaminophen (NORCO/VICODIN) 5-325 MG per tablet 1 tablet (not administered)  ibuprofen (ADVIL,MOTRIN) tablet 400 mg (not administered)  ipratropium-albuterol (DUONEB) 0.5-2.5 (3) MG/3ML nebulizer solution 3 mL (not administered)  albuterol (PROVENTIL) (2.5 MG/3ML) 0.083% nebulizer solution 2.5 mg (not administered)     Initial Impression / Assessment and Plan / ED Course  I have reviewed the triage vital signs and the nursing notes.  Pertinent labs & imaging results that were available during my care of the patient were reviewed by me and considered in my medical decision making (see chart for details).  MDM Reviewed: previous chart, nursing note and vitals Reviewed previous: labs Interpretation: labs, ECG and x-ray   Results for orders placed or performed during the hospital encounter of 08/16/16  Basic metabolic panel  Result Value Ref Range   Sodium 137 135 - 145 mmol/L   Potassium 3.7 3.5 - 5.1 mmol/L   Chloride 110 101 - 111 mmol/L   CO2 23 22 - 32 mmol/L   Glucose, Bld 97 65 - 99 mg/dL   BUN 14 6 - 20 mg/dL   Creatinine, Ser 6.040.86 0.44 - 1.00 mg/dL   Calcium 8.3 (L) 8.9 - 10.3 mg/dL   GFR calc non Af Amer >60 >60 mL/min   GFR calc Af Amer >60 >60 mL/min   Anion gap 4 (L) 5 - 15  CBC  Result Value Ref Range   WBC 10.4 4.0 - 10.5 K/uL   RBC  4.68 3.87 - 5.11 MIL/uL   Hemoglobin 13.6 12.0 - 15.0 g/dL   HCT 54.040.9 98.136.0 - 19.146.0 %   MCV 87.4 78.0 - 100.0 fL   MCH 29.1 26.0 - 34.0 pg   MCHC 33.3 30.0 - 36.0 g/dL   RDW 47.813.8 29.511.5 - 62.115.5 %   Platelets 191 150 - 400 K/uL  Troponin I  Result Value Ref Range   Troponin I <0.03 <0.03 ng/mL  D-dimer, quantitative  Result Value Ref Range   D-Dimer, Quant 0.47 0.00 - 0.50 ug/mL-FEU   Dg Chest 2 View Result Date: 08/16/2016 CLINICAL DATA:  Chest pain and shortness of breath EXAM: CHEST  2 VIEW COMPARISON:  None. FINDINGS: Lungs are clear. Heart size and pulmonary vascularity are normal. No adenopathy. No bone lesions. No pneumothorax. IMPRESSION: No edema or consolidation. Electronically Signed   By: Bretta BangWilliam  Woodruff III M.D.   On: 08/16/2016 08:22    0915:  Doubt PE as cause for symptoms with normal d-dimer and low risk [place].  Doubt ACS as cause for symptoms with normal troponin and  EKG with atypical symptoms, TIMI 0/HEART score 0. Pt feels somewhat better after meds and neb. Lungs now CTA bilat, resps easy, NAD. Tx symptomatically, f/u PMD. Dx and testing d/w pt.  Questions answered.  Verb understanding, agreeable to d/c home with outpt f/u.     Final Clinical Impressions(s) / ED Diagnoses   Final diagnoses:  None    New Prescriptions New Prescriptions   No medications on file     Samuel Jester, DO 08/19/16 1508

## 2016-08-16 NOTE — Discharge Instructions (Signed)
Take the prescriptions as directed.  Apply moist heat or ice to the area(s) of discomfort, for 15 minutes at a time, several times per day for the next few days.  Do not fall asleep on a heating or ice pack.  Call your regular medical doctor on Monday to schedule a follow up appointment in the next 3 days.  Return to the Emergency Department immediately if worsening. ° °

## 2016-08-16 NOTE — Progress Notes (Signed)
Post nebulizer treatment RLL fine crackles still noted.

## 2017-04-13 ENCOUNTER — Encounter (HOSPITAL_COMMUNITY): Payer: Self-pay | Admitting: Emergency Medicine

## 2017-04-13 ENCOUNTER — Emergency Department (HOSPITAL_COMMUNITY)
Admission: EM | Admit: 2017-04-13 | Discharge: 2017-04-13 | Disposition: A | Discharge status: Home / Self Care | Payer: Self-pay | Attending: Emergency Medicine | Admitting: Emergency Medicine

## 2017-04-13 DIAGNOSIS — S39012A Strain of muscle, fascia and tendon of lower back, initial encounter: Secondary | ICD-10-CM | POA: Insufficient documentation

## 2017-04-13 DIAGNOSIS — Y929 Unspecified place or not applicable: Secondary | ICD-10-CM | POA: Insufficient documentation

## 2017-04-13 DIAGNOSIS — Y939 Activity, unspecified: Secondary | ICD-10-CM | POA: Insufficient documentation

## 2017-04-13 DIAGNOSIS — Y999 Unspecified external cause status: Secondary | ICD-10-CM | POA: Insufficient documentation

## 2017-04-13 DIAGNOSIS — F17210 Nicotine dependence, cigarettes, uncomplicated: Secondary | ICD-10-CM | POA: Insufficient documentation

## 2017-04-13 MED ORDER — NAPROXEN 500 MG PO TABS: 500.0000 mg | ORAL_TABLET | Freq: Two times a day (BID) | ORAL | 0 refills | Status: DC | PRN

## 2017-04-13 MED ORDER — KETOROLAC TROMETHAMINE 60 MG/2ML IM SOLN
60.0000 mg | Freq: Once | INTRAMUSCULAR | Status: AC
Start: 2017-04-13 — End: 2017-04-13
  Administered 2017-04-13: 60 mg via INTRAMUSCULAR
  Filled 2017-04-13: qty 2

## 2017-04-13 MED ORDER — METHOCARBAMOL 500 MG PO TABS: 1000.0000 mg | ORAL_TABLET | Freq: Three times a day (TID) | ORAL | 0 refills | Status: DC | PRN

## 2017-04-13 MED ORDER — METHOCARBAMOL 500 MG PO TABS
1000.0000 mg | ORAL_TABLET | Freq: Once | ORAL | Status: AC
  Administered 2017-04-13: 1000 mg via ORAL
  Filled 2017-04-13: qty 2

## 2017-04-13 NOTE — ED Notes (Signed)
ED Provider at bedside. 
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
## 2019-11-13 ENCOUNTER — Encounter (HOSPITAL_COMMUNITY): Payer: Self-pay

## 2019-11-13 ENCOUNTER — Other Ambulatory Visit: Payer: Self-pay

## 2019-11-13 ENCOUNTER — Emergency Department (HOSPITAL_COMMUNITY)
Admission: EM | Admit: 2019-11-13 | Discharge: 2019-11-13 | Disposition: A | Discharge status: Home / Self Care | Payer: Self-pay | Attending: Emergency Medicine | Admitting: Emergency Medicine

## 2019-11-13 ENCOUNTER — Emergency Department (HOSPITAL_COMMUNITY): Payer: Self-pay

## 2019-11-13 DIAGNOSIS — Y929 Unspecified place or not applicable: Secondary | ICD-10-CM | POA: Insufficient documentation

## 2019-11-13 DIAGNOSIS — Y939 Activity, unspecified: Secondary | ICD-10-CM | POA: Insufficient documentation

## 2019-11-13 DIAGNOSIS — S92421A Displaced fracture of distal phalanx of right great toe, initial encounter for closed fracture: Secondary | ICD-10-CM | POA: Insufficient documentation

## 2019-11-13 DIAGNOSIS — Z79899 Other long term (current) drug therapy: Secondary | ICD-10-CM | POA: Insufficient documentation

## 2019-11-13 DIAGNOSIS — Y999 Unspecified external cause status: Secondary | ICD-10-CM | POA: Insufficient documentation

## 2019-11-13 DIAGNOSIS — F17210 Nicotine dependence, cigarettes, uncomplicated: Secondary | ICD-10-CM | POA: Insufficient documentation

## 2019-11-13 DIAGNOSIS — X58XXXA Exposure to other specified factors, initial encounter: Secondary | ICD-10-CM | POA: Insufficient documentation

## 2019-11-13 MED ORDER — HYDROCODONE-ACETAMINOPHEN 5-325 MG PO TABS: 2.0000 | ORAL_TABLET | ORAL | 0 refills | Status: DC | PRN

## 2019-11-13 NOTE — ED Triage Notes (Signed)
Pt reports that she was stepping over rail road tie and "stumped" right great toe. Pt has been taking ibuprofen . This happened yesterday

## 2019-11-13 NOTE — ED Provider Notes (Signed)
[facility] EMERGENCY DEPARTMENT Provider Note   CSN: [id number] Arrival date & time: 11/13/19  4098     History   Chief Complaint Chief Complaint  Patient presents with  . Toe Pain    HPI Emily [name] is a 28 y.o. female.     The history is provided by the patient. No language interpreter was used.  Toe Pain This is a new problem. The current episode started yesterday. The problem occurs constantly. The problem has been gradually worsening. Nothing aggravates the symptoms. Nothing relieves the symptoms. She has tried nothing for the symptoms. The treatment provided no relief.  Pt complains of hitting her toe on a railroad track.  Pt complains of bruising and pain   History reviewed. No pertinent past medical history.  There are no active problems to display for this patient.   History reviewed. No pertinent surgical history.   OB History   No obstetric history on file.      Home Medications    Prior to Admission medications   Medication Sig Start Date End Date Taking? Authorizing Provider  acetaminophen (TYLENOL) 500 MG tablet Take 1,000 mg by mouth every 6 (six) hours as needed for moderate pain.   Yes [provider]  etonogestrel (IMPLANON) 68 MG IMPL implant Inject 1 each into the skin once.   Yes [provider]  ibuprofen (ADVIL) 200 MG tablet Take 800 mg by mouth every 6 (six) hours as needed for moderate pain.   Yes [provider]    Family History No family history on file.  Social History Social History   Tobacco Use  . Smoking status: Current Every Day Smoker    Packs/day: 1.00    Types: Cigarettes  . Smokeless tobacco: Never Used  Substance Use Topics  . Alcohol use: Not Currently  . Drug use: No     Allergies   Patient has no known allergies.   Review of Systems Review of Systems  Musculoskeletal: Positive for joint swelling and myalgias.  Skin: Positive for color change.  All other systems reviewed  and are negative.    Physical Exam Updated Vital Signs BP 116/67 (BP Location: Right Arm)   Pulse 80   Temp 98.2 F (36.8 C) (Oral)   Resp 16   Ht 5\' 2"  (1.575 m)   Wt 72.6 kg   LMP 10/30/2019   SpO2 100%   BMI 29.26 kg/m   Physical Exam Vitals signs and nursing note reviewed.  Musculoskeletal:        General: Swelling and tenderness present.  Skin:    General: Skin is warm.  Neurological:     General: No focal deficit present.  Psychiatric:        Mood and Affect: Mood normal.      ED Treatments / Results  Labs (all labs ordered are listed, but only abnormal results are displayed) Labs Reviewed - No data to display  EKG None  Radiology Dg Toe Great Right  Result Date: 11/13/2019 CLINICAL DATA:  Right great toe pain after hitting her toe on a railroad tie. EXAM: RIGHT GREAT TOE COMPARISON:  None. FINDINGS: Comminuted fracture of the proximal half of the 1st distal phalanx. This extends into the IP joint. No significant displacement or angulation. IMPRESSION: Comminuted intra-articular 1st distal phalanx fracture. Electronically Signed   By: Claudie Revering M.D.   On: 11/13/2019 09:53    Procedures Procedures (including critical care time)  Medications Ordered in ED Medications - No  data to display   Initial Impression / Assessment and Plan / ED Course  I have reviewed the triage vital signs and the nursing notes.  Pertinent labs & imaging results that were available during my care of the patient were reviewed by me and considered in my medical decision making (see chart for details).        MDM  Xray shows a fracture to distal phalanx. Pt plced in a post op shoe and buddy tape  Final Clinical Impressions(s) / ED Diagnoses   Final diagnoses:  Closed displaced fracture of distal phalanx of right great toe, initial encounter    ED Discharge Orders         Ordered    HYDROcodone-acetaminophen (NORCO/VICODIN) 5-325 MG tablet  Every 4 hours PRN      11/13/19 1011        An After Visit Summary was printed and given to the patient.    Elson Areas, PA-C 11/13/19 1012    Donnetta Hutching, MD 11/16/19 [phone]

## 2020-01-10 ENCOUNTER — Other Ambulatory Visit: Payer: Self-pay

## 2020-01-10 ENCOUNTER — Ambulatory Visit: Payer: HRSA Program | Attending: Internal Medicine

## 2020-01-10 DIAGNOSIS — Z20822 Contact with and (suspected) exposure to covid-19: Secondary | ICD-10-CM | POA: Diagnosis present

## 2020-01-11 LAB — NOVEL CORONAVIRUS, NAA: SARS-CoV-2, NAA: NOT DETECTED

## 2020-01-13 ENCOUNTER — Telehealth: Payer: Self-pay

## 2020-01-13 NOTE — Telephone Encounter (Signed)
Patient called and she was informed that her COVID-19 test 01/10/20 was negative.  She was not infected with the Novel Coronavirus.  She states she hd a positive exposure through work.  She was advised that she should quarantine and continue to monitor for symptoms for 14 day post exposure.  She verbalized understanding.

## 2020-03-18 ENCOUNTER — Other Ambulatory Visit: Payer: Self-pay

## 2020-03-18 ENCOUNTER — Telehealth: Payer: Self-pay

## 2020-03-18 ENCOUNTER — Ambulatory Visit
Admission: EM | Admit: 2020-03-18 | Discharge: 2020-03-18 | Disposition: A | Discharge status: Home / Self Care | Payer: Self-pay | Attending: Emergency Medicine | Admitting: Emergency Medicine

## 2020-03-18 DIAGNOSIS — M62838 Other muscle spasm: Secondary | ICD-10-CM

## 2020-03-18 DIAGNOSIS — S161XXA Strain of muscle, fascia and tendon at neck level, initial encounter: Secondary | ICD-10-CM

## 2020-03-18 DIAGNOSIS — M542 Cervicalgia: Secondary | ICD-10-CM

## 2020-03-18 MED ORDER — DEXAMETHASONE SODIUM PHOSPHATE 10 MG/ML IJ SOLN
10.0000 mg | Freq: Once | INTRAMUSCULAR | Status: AC
  Administered 2020-03-18: 10 mg via INTRAMUSCULAR

## 2020-03-18 MED ORDER — KETOROLAC TROMETHAMINE 60 MG/2ML IM SOLN
60.0000 mg | Freq: Once | INTRAMUSCULAR | Status: AC
  Administered 2020-03-18: 60 mg via INTRAMUSCULAR

## 2020-03-18 MED ORDER — PREDNISONE 20 MG PO TABS: 20.0000 mg | ORAL_TABLET | Freq: Two times a day (BID) | ORAL | 0 refills | Status: DC

## 2020-03-18 MED ORDER — PREDNISONE 20 MG PO TABS: 20.0000 mg | ORAL_TABLET | Freq: Two times a day (BID) | ORAL | 0 refills | Status: AC

## 2020-03-18 MED ORDER — CYCLOBENZAPRINE HCL 10 MG PO TABS: 10.0000 mg | ORAL_TABLET | Freq: Every day | ORAL | 0 refills | Status: DC

## 2020-03-18 NOTE — Discharge Instructions (Signed)
Decadron and toradol shots given in office Continue conservative management of rest, ice, heat, and gentle stretches Prednisone prescribed.  Take as directed and to completion Take cyclobenzaprine at nighttime for symptomatic relief. Avoid driving or operating heavy machinery while using medication. Follow up with PCP if symptoms persist Return or go to the ER if you have any new or worsening symptoms (fever, chills, nausea, vomiting, chest pain, abdominal pain, changes in bowel or bladder habits, pain radiating into arms, etc...)

## 2020-03-18 NOTE — ED Provider Notes (Signed)
[facility]   [id number] 03/18/20 Arrival Time: 5852  CC: Neck pain  SUBJECTIVE: History from: patient. Emily [name] is a 29 y.o. female complains of neck pain that began 3 days ago.  Denies a precipitating event or injury.  However, did have an episode of reflux and may have "thrown her neck out" while vomiting.  Localizes the pain to the neck.  Describes the pain as constant and throbbing, and intermittent sharp in character.  Has tried OTC medications without relief.  Symptoms are made worse with neck ROM.  Denies similar symptoms in the past.  Denies fever, chills, rhinorrhea, congestion, sore throat, cough, erythema, ecchymosis, effusion, weakness, numbness and tingling.    ROS: As per HPI.  All other pertinent ROS negative.     History reviewed. No pertinent past medical history. History reviewed. No pertinent surgical history. No Known Allergies No current facility-administered medications on file prior to encounter.   Current Outpatient Medications on File Prior to Encounter  Medication Sig Dispense Refill  . acetaminophen (TYLENOL) 500 MG tablet Take 1,000 mg by mouth every 6 (six) hours as needed for moderate pain.    Marland Kitchen etonogestrel (IMPLANON) 68 MG IMPL implant Inject 1 each into the skin once.    Marland Kitchen ibuprofen (ADVIL) 200 MG tablet Take 800 mg by mouth every 6 (six) hours as needed for moderate pain.     Social History   Socioeconomic History  . Marital status: Single    Spouse name: Not on file  . Number of children: Not on file  . Years of education: Not on file  . Highest education level: Not on file  Occupational History  . Not on file  Tobacco Use  . Smoking status: Current Every Day Smoker    Packs/day: 1.00    Types: Cigarettes  . Smokeless tobacco: Never Used  Substance and Sexual Activity  . Alcohol use: Not Currently  . Drug use: No  . Sexual activity: Yes    Birth control/protection: Implant  Other Topics Concern  . Not on file  Social  History Narrative  . Not on file   Social Determinants of Health   Financial Resource Strain:   . Difficulty of Paying Living Expenses:   Food Insecurity:   . Worried About Charity fundraiser in the Last Year:   . Arboriculturist in the Last Year:   Transportation Needs:   . Film/video editor (Medical):   Marland Kitchen Lack of Transportation (Non-Medical):   Physical Activity:   . Days of Exercise per Week:   . Minutes of Exercise per Session:   Stress:   . Feeling of Stress :   Social Connections:   . Frequency of Communication with Friends and Family:   . Frequency of Social Gatherings with Friends and Family:   . Attends Religious Services:   . Active Member of Clubs or Organizations:   . Attends Archivist Meetings:   Marland Kitchen Marital Status:   Intimate Partner Violence:   . Fear of Current or Ex-Partner:   . Emotionally Abused:   Marland Kitchen Physically Abused:   . Sexually Abused:    Family History  Problem Relation Age of Onset  . Healthy Mother   . Healthy Father     OBJECTIVE:  Vitals:   03/18/20 1306  BP: 116/74  Pulse: 84  Resp: 18  Temp: 98 F (36.7 C)  SpO2: 96%    General appearance: ALERT; in no acute distress.  Head: NCAT Lungs: Normal respiratory effort; CTAB CV: RRR Musculoskeletal: Neck  Inspection: Skin warm, dry, clear and intact without obvious erythema, effusion, or ecchymosis.  Palpation: diffusely TTP over c-spine, paracervical and paravertebral muscles, spasm over LT paracervical muscle ROM: LROM about the neck Strength: 5/5 shld abduction, 5/5 shld adduction, 5/5 elbow flexion, 5/5 elbow extension, 5/5 grip strength Skin: warm and dry Neurologic: Ambulates without difficulty; Sensation intact about the upper extremities Psychological: alert and cooperative; normal mood and affect  ASSESSMENT & PLAN:  1. Neck pain   2. Strain of neck muscle, initial encounter   3. Muscle spasm     Meds ordered this encounter  Medications  . ketorolac  (TORADOL) injection 60 mg  . dexamethasone (DECADRON) injection 10 mg  . predniSONE (DELTASONE) 20 MG tablet    Sig: Take 1 tablet (20 mg total) by mouth 2 (two) times daily with a meal for 5 days.    Dispense:  10 tablet    Refill:  0    Order Specific Question:   Supervising Provider    Answer:   Eustace Moore [[id number]]  . cyclobenzaprine (FLEXERIL) 10 MG tablet    Sig: Take 1 tablet (10 mg total) by mouth at bedtime.    Dispense:  15 tablet    Refill:  0    Order Specific Question:   Supervising Provider    Answer:   Eustace Moore [[id number]]   Decadron and toradol shots given in office Continue conservative management of rest, ice, heat, and gentle stretches Prednisone prescribed.  Take as directed and to completion Take cyclobenzaprine at nighttime for symptomatic relief. Avoid driving or operating heavy machinery while using medication. Follow up with PCP if symptoms persist Return or go to the ER if you have any new or worsening symptoms (fever, chills, nausea, vomiting, chest pain, abdominal pain, changes in bowel or bladder habits, pain radiating into arms, etc...)   Reviewed expectations re: course of current medical issues. Questions answered. Outlined signs and symptoms indicating need for more acute intervention. Patient verbalized understanding. After Visit Summary given.    Rennis Harding, PA-C 03/18/20 1334

## 2020-03-18 NOTE — ED Triage Notes (Signed)
Pt presents with c/o neck pain that began yesterday, pain radiates down spine, denies injury

## 2020-11-02 IMAGING — DX DG TOE GREAT 2+V*R*
3 series · 3 of 3 positions shown · non-contrast
Comparison: None.

CLINICAL DATA: Right great toe pain after hitting her toe on a
railroad tie.

EXAM:
RIGHT GREAT TOE

[toe ap]
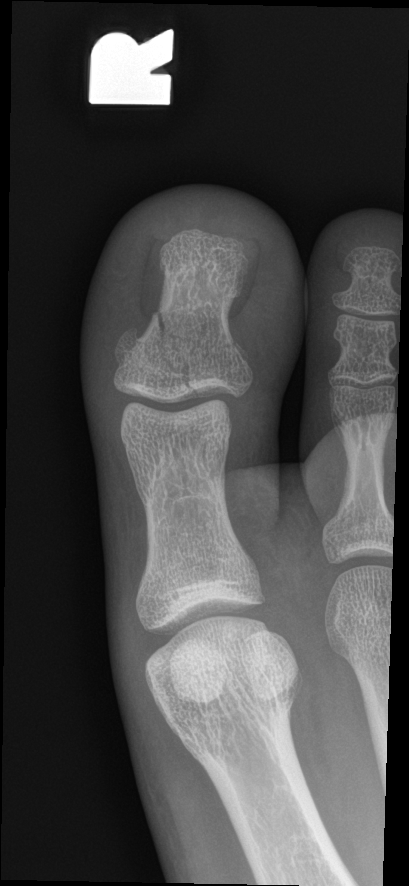

[toe obl]
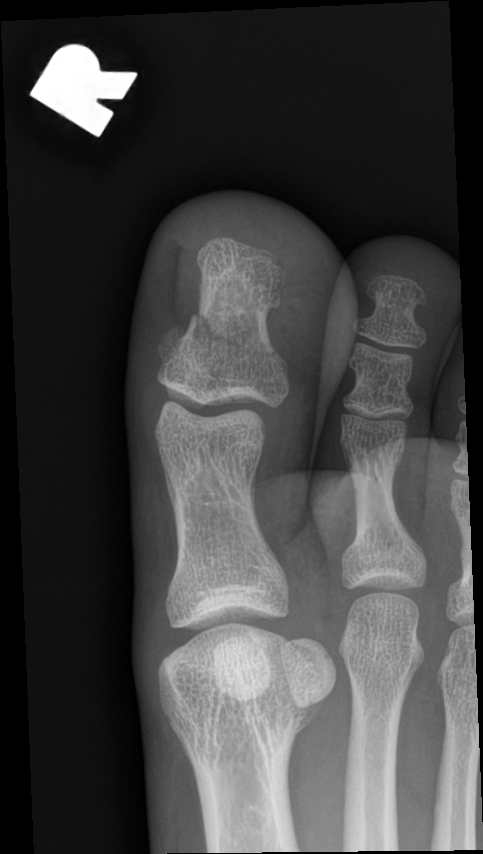

[toe lat]
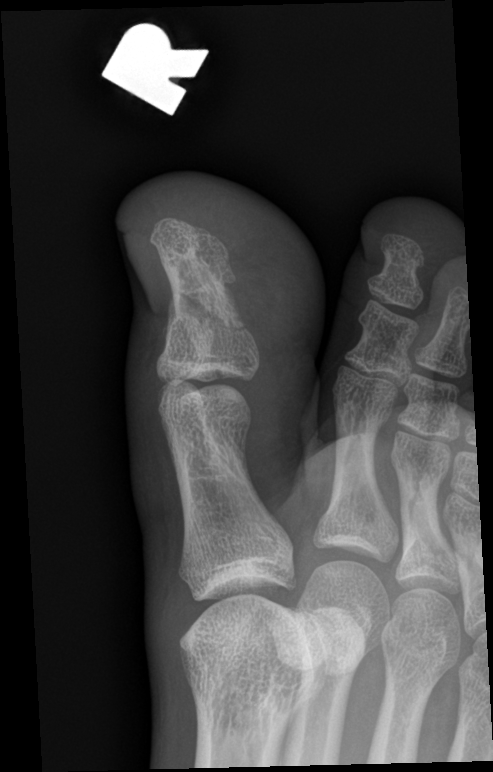

[3 of 3 positions shown; findings below may reference images not displayed]

FINDINGS: Comminuted fracture of the proximal half of the 1st distal phalanx.
This extends into the IP joint. No significant displacement or
angulation.
IMPRESSION: Comminuted intra-articular 1st distal phalanx fracture.

## 2020-11-27 ENCOUNTER — Ambulatory Visit
Admission: EM | Admit: 2020-11-27 | Discharge: 2020-11-27 | Disposition: A | Discharge status: Home / Self Care | Payer: Self-pay | Attending: Emergency Medicine | Admitting: Emergency Medicine

## 2020-11-27 ENCOUNTER — Other Ambulatory Visit: Payer: Self-pay

## 2020-11-27 ENCOUNTER — Encounter: Payer: Self-pay | Admitting: Emergency Medicine

## 2020-11-27 DIAGNOSIS — J069 Acute upper respiratory infection, unspecified: Secondary | ICD-10-CM

## 2020-11-27 DIAGNOSIS — Z1152 Encounter for screening for COVID-19: Secondary | ICD-10-CM

## 2020-11-27 MED ORDER — DEXAMETHASONE 4 MG PO TABS: 4.0000 mg | ORAL_TABLET | Freq: Every day | ORAL | 0 refills | Status: AC

## 2020-11-27 MED ORDER — BENZONATATE 100 MG PO CAPS: 100.0000 mg | ORAL_CAPSULE | Freq: Three times a day (TID) | ORAL | 0 refills | Status: DC

## 2020-11-27 MED ORDER — FLUTICASONE PROPIONATE 50 MCG/ACT NA SUSP: 1.0000 | Freq: Every day | NASAL | 0 refills | Status: DC

## 2020-11-27 MED ORDER — ALBUTEROL SULFATE HFA 108 (90 BASE) MCG/ACT IN AERS: 1.0000 | INHALATION_SPRAY | Freq: Four times a day (QID) | RESPIRATORY_TRACT | 0 refills | Status: DC | PRN

## 2020-11-27 MED ORDER — CETIRIZINE HCL 10 MG PO TABS: 10.0000 mg | ORAL_TABLET | Freq: Every day | ORAL | 0 refills | Status: DC

## 2020-11-27 NOTE — ED Provider Notes (Signed)
[facility]   [id number] 11/27/20 Arrival Time: 1206   Chief Complaint  Patient presents with  . Nasal Congestion     SUBJECTIVE: History from: patient.  Emily [name] is a 29 y.o. female presented to the urgent care for complaint of cough, nasal congestion, headache, loss of taste and smell for the past few days.  Denies sick exposure to COVID, flu or strep.  Denies recent travel.  Has tried OTC medication with little relief.  Denies aggravating factors.  Denies previous symptoms in the past.   Denies fever, chills, fatigue, sinus pain, rhinorrhea, sore throat, SOB, wheezing, chest pain, nausea, changes in bowel or bladder habits.    ROS: As per HPI.  All other pertinent ROS negative.      History reviewed. No pertinent past medical history. History reviewed. No pertinent surgical history. No Known Allergies No current facility-administered medications on file prior to encounter.   Current Outpatient Medications on File Prior to Encounter  Medication Sig Dispense Refill  . acetaminophen (TYLENOL) 500 MG tablet Take 1,000 mg by mouth every 6 (six) hours as needed for moderate pain.    . cyclobenzaprine (FLEXERIL) 10 MG tablet Take 1 tablet (10 mg total) by mouth at bedtime. 15 tablet 0  . etonogestrel (IMPLANON) 68 MG IMPL implant Inject 1 each into the skin once.    Marland Kitchen ibuprofen (ADVIL) 200 MG tablet Take 800 mg by mouth every 6 (six) hours as needed for moderate pain.     Social History   Socioeconomic History  . Marital status: Single    Spouse name: Not on file  . Number of children: Not on file  . Years of education: Not on file  . Highest education level: Not on file  Occupational History  . Not on file  Tobacco Use  . Smoking status: Current Every Day Smoker    Packs/day: 1.00    Types: Cigarettes  . Smokeless tobacco: Never Used  Substance and Sexual Activity  . Alcohol use: Not Currently  . Drug use: No  . Sexual activity: Yes    Birth  control/protection: Implant  Other Topics Concern  . Not on file  Social History Narrative  . Not on file   Social Determinants of Health   Financial Resource Strain:   . Difficulty of Paying Living Expenses: Not on file  Food Insecurity:   . Worried About Programme researcher, broadcasting/film/video in the Last Year: Not on file  . Ran Out of Food in the Last Year: Not on file  Transportation Needs:   . Lack of Transportation (Medical): Not on file  . Lack of Transportation (Non-Medical): Not on file  Physical Activity:   . Days of Exercise per Week: Not on file  . Minutes of Exercise per Session: Not on file  Stress:   . Feeling of Stress : Not on file  Social Connections:   . Frequency of Communication with Friends and Family: Not on file  . Frequency of Social Gatherings with Friends and Family: Not on file  . Attends Religious Services: Not on file  . Active Member of Clubs or Organizations: Not on file  . Attends Banker Meetings: Not on file  . Marital Status: Not on file  Intimate Partner Violence:   . Fear of Current or Ex-Partner: Not on file  . Emotionally Abused: Not on file  . Physically Abused: Not on file  . Sexually Abused: Not on file   Family History  Problem Relation Age of Onset  . Healthy Mother   . Healthy Father     OBJECTIVE:  Vitals:   11/27/20 1211  BP: 111/74  Pulse: 87  Resp: 16  Temp: 98.5 F (36.9 C)  SpO2: 97%     General appearance: alert; appears fatigued, but nontoxic; speaking in full sentences and tolerating own secretions HEENT: NCAT; Ears: EACs clear, TMs pearly gray; Eyes: PERRL.  EOM grossly intact. Sinuses: nontender; Nose: nares patent without rhinorrhea, Throat: oropharynx clear, tonsils non erythematous or enlarged, uvula midline  Neck: supple without LAD Lungs: unlabored respirations, symmetrical air entry; cough: moderate; no respiratory distress; CTAB Heart: regular rate and rhythm.  Radial pulses 2+ symmetrical  bilaterally Skin: warm and dry Psychological: alert and cooperative; normal mood and affect  LABS:  No results found for this or any previous visit (from the past 24 hour(s)).   ASSESSMENT & PLAN:  1. Encounter for screening for COVID-19   2. URI with cough and congestion     Meds ordered this encounter  Medications  . benzonatate (TESSALON) 100 MG capsule    Sig: Take 1 capsule (100 mg total) by mouth every 8 (eight) hours.    Dispense:  30 capsule    Refill:  0  . fluticasone (FLONASE) 50 MCG/ACT nasal spray    Sig: Place 1 spray into both nostrils daily for 14 days.    Dispense:  16 g    Refill:  0  . cetirizine (ZYRTEC ALLERGY) 10 MG tablet    Sig: Take 1 tablet (10 mg total) by mouth daily.    Dispense:  30 tablet    Refill:  0  . dexamethasone (DECADRON) 4 MG tablet    Sig: Take 1 tablet (4 mg total) by mouth daily for 7 days.    Dispense:  7 tablet    Refill:  0  . albuterol (VENTOLIN HFA) 108 (90 Base) MCG/ACT inhaler    Sig: Inhale 1-2 puffs into the lungs every 6 (six) hours as needed for wheezing or shortness of breath.    Dispense:  18 g    Refill:  0    Discharge instructions  COVID testing ordered.  It will take between 2-7 days for test results.  Someone will contact you regarding abnormal results.    In the meantime: You should remain isolated in your home for 10 days from symptom onset AND greater than 24 hours after symptoms resolution (absence of fever without the use of fever-reducing medication and improvement in respiratory symptoms), whichever is longer Get plenty of rest and push fluids Tessalon Perles prescribed for cough Zyrtec for nasal congestion, runny nose, and/or sore throat Flonase for nasal congestion and runny nose Decadron was prescribed Use medications daily for symptom relief Use OTC medications like ibuprofen or tylenol as needed fever or pain Call or go to the ED if you have any new or worsening symptoms such as fever,  worsening cough, shortness of breath, chest tightness, chest pain, turning blue, changes in mental status, etc...   Reviewed expectations re: course of current medical issues. Questions answered. Outlined signs and symptoms indicating need for more acute intervention. Patient verbalized understanding. After Visit Summary given.         Durward Parcel, FNP 11/27/20 1238

## 2020-11-27 NOTE — ED Triage Notes (Signed)
Patient states that she has sinus congestion with headache, lost taste and smell yesterday. needs covid test for work.

## 2020-11-27 NOTE — Discharge Instructions (Addendum)

## 2020-11-29 LAB — NOVEL CORONAVIRUS, NAA: SARS-CoV-2, NAA: DETECTED — AB

## 2020-11-29 LAB — SARS-COV-2, NAA 2 DAY TAT

## 2020-11-30 ENCOUNTER — Telehealth (HOSPITAL_COMMUNITY): Payer: Self-pay

## 2020-11-30 NOTE — Telephone Encounter (Signed)
Called to Discuss with patient about Covid symptoms and the use of the monoclonal antibody infusion for those with mild to moderate Covid symptoms and at a high risk of hospitalization.     Pt appears to qualify for this infusion due to co-morbid conditions and/or a member of an at-risk group in accordance with the FDA Emergency Use Authorization.    Unable to reach pt, left VM message to return call at [phone].

## 2020-12-29 NOTE — L&D Delivery Note (Signed)
[facility] Delivery Note  Emily [name] is a 30 y.o. [id number] s/p SVD at [redacted]w[redacted]d. She was admitted for IOL for gHTN (labs normal and BP stable on no meds) and A1GDM (glucose stable throughout labor).   ROM: 13h 77m with clear fluid GBS Status: negative Maximum Maternal Temperature: 98.5  Labor Progress: Induction begun with cytotec, FB in from 2330 last night to 0240 this morning, then AROM at 0404 and Pitocin titration with position changes as tolerated by the baby  Delivery Date/Time: 12/19/21 at 1714 Delivery: Called to room and patient was complete and pushing. Head delivered LOA. Nuchal and body cord present. Shoulder and body delivered using somersault maneuver to reduce cords. Infant with spontaneous cry, placed on mother's abdomen, dried and stimulated. Cord clamped x 2 after 1-minute delay, and cut by patient. Cord blood drawn and cord sample collected. Placenta delivered spontaneously, intact, with 3-vessel cord. Fundus firm with massage and Pitocin. Labia, perineum, vagina, and cervix inspected, no laceration found.  Placenta: spontaneous, intact to L&D Complications: none Lacerations: none EBL: 75 Analgesia: epidural  Postpartum Planning [x]  transfer orders to MB [x]  discharge summary started & shared [x]  message to sent to schedule follow-up  [x]  lists updated [x]  vaccines UTD  Infant: Girl   APGARs 6/9   3020g  , CNM, IBCLC Certified Nurse Midwife, [facility] for , [facility], [facility] 12/19/2021, 7:30 PM

## 2021-03-19 ENCOUNTER — Other Ambulatory Visit: Payer: Self-pay

## 2021-03-19 ENCOUNTER — Encounter (HOSPITAL_COMMUNITY): Payer: Self-pay

## 2021-03-19 ENCOUNTER — Telehealth: Payer: Self-pay | Admitting: Obstetrics & Gynecology

## 2021-03-19 ENCOUNTER — Emergency Department (HOSPITAL_COMMUNITY): Payer: Self-pay

## 2021-03-19 ENCOUNTER — Telehealth: Payer: Self-pay

## 2021-03-19 ENCOUNTER — Emergency Department (HOSPITAL_COMMUNITY)
Admission: EM | Admit: 2021-03-19 | Discharge: 2021-03-19 | Disposition: A | Discharge status: Home / Self Care | Payer: Self-pay | Attending: Emergency Medicine | Admitting: Emergency Medicine

## 2021-03-19 DIAGNOSIS — F17210 Nicotine dependence, cigarettes, uncomplicated: Secondary | ICD-10-CM | POA: Insufficient documentation

## 2021-03-19 DIAGNOSIS — O3680X0 Pregnancy with inconclusive fetal viability, not applicable or unspecified: Secondary | ICD-10-CM

## 2021-03-19 DIAGNOSIS — Z3490 Encounter for supervision of normal pregnancy, unspecified, unspecified trimester: Secondary | ICD-10-CM

## 2021-03-19 DIAGNOSIS — O26891 Other specified pregnancy related conditions, first trimester: Secondary | ICD-10-CM | POA: Insufficient documentation

## 2021-03-19 DIAGNOSIS — Z3A01 Less than 8 weeks gestation of pregnancy: Secondary | ICD-10-CM | POA: Insufficient documentation

## 2021-03-19 DIAGNOSIS — N939 Abnormal uterine and vaginal bleeding, unspecified: Secondary | ICD-10-CM | POA: Insufficient documentation

## 2021-03-19 LAB — CBC WITH DIFFERENTIAL/PLATELET
Abs Immature Granulocytes: 0.03 10*3/uL (ref 0.00–0.07)
Basophils Absolute: 0.1 10*3/uL (ref 0.0–0.1)
Basophils Relative: 1 %
Eosinophils Absolute: 0.2 10*3/uL (ref 0.0–0.5)
Eosinophils Relative: 3 %
HCT: 42 % (ref 36.0–46.0)
Hemoglobin: 13.8 g/dL (ref 12.0–15.0)
Immature Granulocytes: 0 %
Lymphocytes Relative: 32 %
Lymphs Abs: 2.3 10*3/uL (ref 0.7–4.0)
MCH: 29.7 pg (ref 26.0–34.0)
MCHC: 32.9 g/dL (ref 30.0–36.0)
MCV: 90.5 fL (ref 80.0–100.0)
Monocytes Absolute: 0.6 10*3/uL (ref 0.1–1.0)
Monocytes Relative: 8 %
Neutro Abs: 3.9 10*3/uL (ref 1.7–7.7)
Neutrophils Relative %: 56 %
Platelets: 213 10*3/uL (ref 150–400)
RBC: 4.64 MIL/uL (ref 3.87–5.11)
RDW: 14.1 % (ref 11.5–15.5)
WBC: 7.1 10*3/uL (ref 4.0–10.5)
nRBC: 0 % (ref 0.0–0.2)

## 2021-03-19 LAB — WET PREP, GENITAL
Clue Cells Wet Prep HPF POC: NONE SEEN
Sperm: NONE SEEN
Trich, Wet Prep: NONE SEEN
Yeast Wet Prep HPF POC: NONE SEEN

## 2021-03-19 LAB — URINALYSIS, ROUTINE W REFLEX MICROSCOPIC
Bilirubin Urine: NEGATIVE
Glucose, UA: NEGATIVE mg/dL
Ketones, ur: NEGATIVE mg/dL
Leukocytes,Ua: NEGATIVE
Nitrite: NEGATIVE
Protein, ur: NEGATIVE mg/dL
Specific Gravity, Urine: 1.011 (ref 1.005–1.030)
pH: 6 (ref 5.0–8.0)

## 2021-03-19 LAB — BASIC METABOLIC PANEL
Anion gap: 10 (ref 5–15)
BUN: 8 mg/dL (ref 6–20)
CO2: 19 mmol/L — ABNORMAL LOW (ref 22–32)
Calcium: 8.7 mg/dL — ABNORMAL LOW (ref 8.9–10.3)
Chloride: 108 mmol/L (ref 98–111)
Creatinine, Ser: 0.79 mg/dL (ref 0.44–1.00)
GFR, Estimated: >60 mL/min (ref >60)
Glucose, Bld: 122 mg/dL — ABNORMAL HIGH (ref 70–99)
Potassium: 3.2 mmol/L — ABNORMAL LOW (ref 3.5–5.1)
Sodium: 137 mmol/L (ref 135–145)

## 2021-03-19 LAB — HIV ANTIBODY (ROUTINE TESTING W REFLEX): HIV Screen 4th Generation wRfx: NONREACTIVE

## 2021-03-19 LAB — ABO/RH: ABO/RH(D): A POS

## 2021-03-19 LAB — HCG, QUANTITATIVE, PREGNANCY: hCG, Beta Chain, Quant, S: 340 m[IU]/mL — ABNORMAL HIGH (ref <5)

## 2021-03-19 NOTE — ED Triage Notes (Signed)
Pt states that she took her first test 3 week ago and it was positive. States shes taken at least 12 more at home since then. Pt was on the 17th and got a neg result. Pt says she went home and tested positive again. Complains of vaginal bleeding that started this morning. Denies any pain.

## 2021-03-19 NOTE — Telephone Encounter (Signed)
Pt sent a MyChart message at 5:18pm on 03/18/21 stating I have a little blood whenwent to the bathroom, do I need to go to the hospital?

## 2021-03-19 NOTE — Telephone Encounter (Signed)
Per chart review patient at [facility] ED now.

## 2021-03-19 NOTE — Telephone Encounter (Signed)
Patient states she went to the ER for vaginal bleeding.  Was told to f/u with our office.  Labs and u/s reviewed by JAG and advised for patient to have f/u hcg on Thursday. Pt denies cramping.  In the meantime, advised if she developed severe right or left lower abdominal pain, to let us know or go back to the hospital.  Pt verbalized understanding and agreeable to do so.

## 2021-03-19 NOTE — Discharge Instructions (Signed)
The ultrasound does not show an obvious pregnancy or where the pregnancy is located.  Due to this, you need repeat pregnancy levels (beta-hCG) in the next 2-3 days as well as a repeat ultrasound.  If any new or worsening symptoms arise such as severe abdominal pain, dizziness, vomiting, or any other new/concerning symptoms then return to the ER

## 2021-03-19 NOTE — ED Provider Notes (Signed)
[facility] EMERGENCY DEPARTMENT Provider Note   CSN: [id number] Arrival date & time: 03/19/21  2330     History Chief Complaint  Patient presents with  . pregnancy bleeding    Emily [name] is a 30 y.o. female.  HPI 30 year old female presents with vaginal bleeding.  Patient has had multiple positive pregnancy test at home.  Due to this she is a G3 P1.  Last menstrual cycle was February 11, has irregular cycles.  She states that she went to a clinic where she had a negative pregnancy test.  However then started having some cramping and retested and was still pregnant on her home test.  The cramping is gone since yesterday but now she had some vaginal bleeding when she woke up this morning.  No vomiting.  She does not know her blood type.  History reviewed. No pertinent past medical history.  There are no problems to display for this patient.   History reviewed. No pertinent surgical history.   OB History   No obstetric history on file.     Family History  Problem Relation Age of Onset  . Healthy Mother   . Healthy Father     Social History   Tobacco Use  . Smoking status: Current Every Day Smoker    Packs/day: 1.00    Types: Cigarettes  . Smokeless tobacco: Never Used  Substance Use Topics  . Alcohol use: Not Currently  . Drug use: No    Home Medications Prior to Admission medications   Medication Sig Start Date End Date Taking? Authorizing Provider  acetaminophen (TYLENOL) 500 MG tablet Take 1,000 mg by mouth every 6 (six) hours as needed for moderate pain.   Yes [provider]  albuterol (VENTOLIN HFA) 108 (90 Base) MCG/ACT inhaler Inhale 1-2 puffs into the lungs every 6 (six) hours as needed for wheezing or shortness of breath. Patient not taking: Reported on 03/19/2021 11/27/20   Durward Parcel, FNP  benzonatate (TESSALON) 100 MG capsule Take 1 capsule (100 mg total) by mouth every 8 (eight) hours. Patient not taking: Reported on 03/19/2021  11/27/20   Durward Parcel, FNP  cetirizine (ZYRTEC ALLERGY) 10 MG tablet Take 1 tablet (10 mg total) by mouth daily. Patient not taking: Reported on 03/19/2021 11/27/20   Durward Parcel, FNP  cyclobenzaprine (FLEXERIL) 10 MG tablet Take 1 tablet (10 mg total) by mouth at bedtime. Patient not taking: Reported on 03/19/2021 03/18/20   [place], [place], PA-C  fluticasone [facility]) 50 MCG/ACT nasal spray Place 1 spray into both nostrils daily for 14 days. 11/27/20 12/11/20  Durward Parcel, FNP    Allergies    Patient has no known allergies.  Review of Systems   Review of Systems  Gastrointestinal: Positive for abdominal pain. Negative for vomiting.  Genitourinary: Positive for vaginal bleeding. Negative for dysuria.  All other systems reviewed and are negative.   Physical Exam Updated Vital Signs BP 105/60   Pulse 62   Temp 97.8 F (36.6 C) (Oral)   Resp 18   Ht 5\' 2"  (1.575 m)   Wt 68 kg   SpO2 99%   BMI 27.44 kg/m   Physical Exam Vitals and nursing note reviewed. Exam conducted with a chaperone present.  Constitutional:      Appearance: She is well-developed.  HENT:     Head: Normocephalic and atraumatic.     Right Ear: External ear normal.     Left Ear: External ear normal.  Nose: Nose normal.  Eyes:     General:        Right eye: No discharge.        Left eye: No discharge.  Cardiovascular:     Rate and Rhythm: Normal rate and regular rhythm.     Heart sounds: Normal heart sounds.  Pulmonary:     Effort: Pulmonary effort is normal.     Breath sounds: Normal breath sounds.  Abdominal:     General: There is no distension.     Palpations: Abdomen is soft.     Tenderness: There is no abdominal tenderness.  Genitourinary:    Vagina: Bleeding present.     Cervix: Cervical bleeding present.  Skin:    General: Skin is warm and dry.  Neurological:     Mental Status: She is alert.  Psychiatric:        Mood and Affect: Mood is not anxious.     ED  Results / Procedures / Treatments   Labs (all labs ordered are listed, but only abnormal results are displayed) Labs Reviewed  WET PREP, GENITAL - Abnormal; Notable for the following components:      Result Value   WBC, Wet Prep HPF POC FEW (*)    All other components within normal limits  BASIC METABOLIC PANEL - Abnormal; Notable for the following components:   Potassium 3.2 (*)    CO2 19 (*)    Glucose, Bld 122 (*)    Calcium 8.7 (*)    All other components within normal limits  HCG, QUANTITATIVE, PREGNANCY - Abnormal; Notable for the following components:   hCG, Beta Chain, Quant, S 340 (*)    All other components within normal limits  URINALYSIS, ROUTINE W REFLEX MICROSCOPIC - Abnormal; Notable for the following components:   APPearance HAZY (*)    Hgb urine dipstick LARGE (*)    Bacteria, UA RARE (*)    All other components within normal limits  CBC WITH DIFFERENTIAL/PLATELET  RPR  HIV ANTIBODY (ROUTINE TESTING W REFLEX)  ABO/RH  GC/CHLAMYDIA PROBE AMP ([place]) NOT AT [facility]    EKG None  Radiology US OB LESS THAN 14 WEEKS WITH OB TRANSVAGINAL  Result Date: 03/19/2021 CLINICAL DATA:  Vaginal bleeding, positive urine pregnancy test, LMP 02/08/2021 EXAM: OBSTETRIC <14 WK [place] AND TRANSVAGINAL OB US TECHNIQUE: Both transabdominal and transvaginal ultrasound examinations were performed for complete evaluation of the gestation as well as the maternal uterus, adnexal regions, and pelvic cul-de-sac. Transvaginal technique was performed to assess early pregnancy. COMPARISON:  None. FINDINGS: Intrauterine gestational sac: None identified Subchorionic hemorrhage:  None visualized. Maternal uterus/adnexae: The uterus is anteverted. The uterus measures roughly 8.1 x 2.3 x 3.7 cm in greatest dimension. The cervix is unremarkable. No intrauterine masses are seen. There are multiple hyperechoic foci noted at the endometrial/myometrial junction within the left uterine fundus in the region of  the cornua which may represent microcalcifications related to prior infection or surgical intervention. The endometrial stripe is normal in thickness measuring 7 mm. A 2 mm cystic structure is identified within the a endometrial cavity within the mid body of the uterus, indeterminate. There is no free fluid within the cul-de-sac. The ovaries are normal in size and echogenicity. No adnexal masses are seen. IMPRESSION: No intrauterine gestational sac identified. Pregnancy location not visualized sonographically. Differential diagnosis includes recent spontaneous abortion, IUP too early to visualize, and non-visualized ectopic pregnancy. Recommend close follow up of quantitative B-HCG levels, and follow up US as clinically  warranted. Multiple echogenic foci within the a left uterine fundus most in keeping with multiple microcalcifications, possibly the sequela of remote infection or surgical intervention 2 mm cystic structure identified within the endometrial cavity, indeterminate. This may represent a small amount of simple fluid or an evolving cyst. This can be reassessed on subsequent sonography if performed. Electronically Signed   By: Helyn Numbers MD   On: 03/19/2021 09:55    Procedures Procedures   Medications Ordered in ED Medications - No data to display  ED Course  I have reviewed the triage vital signs and the nursing notes.  Pertinent labs & imaging results that were available during my care of the patient were reviewed by me and considered in my medical decision making (see chart for details).    MDM Rules/Calculators/A&P                          Patient's hCG indicates pregnancy though if she has been pregnant as long as she states that should be higher.  Labs are otherwise unremarkable including her blood type being A+, no RhoGam indicated.  Ultrasound does not find an obvious pregnancy and thus currently it is unclear whether she has an ectopic not seen versus IUP not seen versus  miscarriage.  At this point she is quite stable and so I think is reasonable to treat her as an outpatient with follow-up [facility] with repeat hCG testing and repeat ultrasound.  The ultrasound findings likely represent her previous [facility] that she stated she had. We discussed return precautions. Final Clinical Impression(s) / ED Diagnoses Final diagnoses:  Pregnancy, location unknown    Rx / DC Orders ED Discharge Orders    None       Pricilla Loveless, MD 03/19/21 1019

## 2021-03-19 NOTE — Telephone Encounter (Signed)
Pt seen at [facility] ED today, had U/S - was told to follow up here Please review notes & advise on scheduling needs  Pt states she sent a picture in MyChart as well

## 2021-03-20 LAB — RPR: RPR Ser Ql: NONREACTIVE

## 2021-03-20 LAB — GC/CHLAMYDIA PROBE AMP (~~LOC~~) NOT AT ARMC
Chlamydia: NEGATIVE
Comment: NEGATIVE
Comment: NORMAL
Neisseria Gonorrhea: NEGATIVE

## 2021-03-21 ENCOUNTER — Other Ambulatory Visit: Payer: Self-pay

## 2021-03-21 DIAGNOSIS — O209 Hemorrhage in early pregnancy, unspecified: Secondary | ICD-10-CM

## 2021-03-21 DIAGNOSIS — O3680X0 Pregnancy with inconclusive fetal viability, not applicable or unspecified: Secondary | ICD-10-CM

## 2021-03-22 ENCOUNTER — Encounter: Payer: Self-pay | Admitting: *Deleted

## 2021-03-22 ENCOUNTER — Telehealth: Payer: Self-pay | Admitting: *Deleted

## 2021-03-22 LAB — BETA HCG QUANT (REF LAB): hCG Quant: 39 m[IU]/mL

## 2021-03-22 NOTE — Telephone Encounter (Signed)
Spoke to patient. Informed she is having a miscarriage.  Will need to repeat labs in 1 weeks.  Pt verbalized understanding. Note sent to patient via mychart for work.

## 2021-03-22 NOTE — Telephone Encounter (Signed)
-----   Message from Adline Potter, NP sent at 03/22/2021 10:00 AM EDT ----- Here you go

## 2021-03-27 ENCOUNTER — Other Ambulatory Visit: Payer: Self-pay

## 2021-03-28 ENCOUNTER — Other Ambulatory Visit: Payer: Self-pay

## 2021-03-28 DIAGNOSIS — O039 Complete or unspecified spontaneous abortion without complication: Secondary | ICD-10-CM

## 2021-03-29 LAB — BETA HCG QUANT (REF LAB): hCG Quant: <1 m[IU]/mL

## 2021-04-02 ENCOUNTER — Encounter: Payer: Self-pay | Admitting: Women's Health

## 2021-04-22 ENCOUNTER — Encounter: Payer: Self-pay | Admitting: Adult Health

## 2021-04-25 ENCOUNTER — Encounter: Payer: Self-pay | Admitting: Adult Health

## 2021-04-25 ENCOUNTER — Ambulatory Visit (INDEPENDENT_AMBULATORY_CARE_PROVIDER_SITE_OTHER): Payer: Self-pay | Admitting: Adult Health

## 2021-04-25 ENCOUNTER — Other Ambulatory Visit: Payer: Self-pay

## 2021-04-25 VITALS — BP 137/70 | HR 86 | Ht 62.0 in | Wt 146.0 lb

## 2021-04-25 DIAGNOSIS — Z3201 Encounter for pregnancy test, result positive: Secondary | ICD-10-CM | POA: Diagnosis not present

## 2021-04-25 DIAGNOSIS — Z3490 Encounter for supervision of normal pregnancy, unspecified, unspecified trimester: Secondary | ICD-10-CM | POA: Insufficient documentation

## 2021-04-25 DIAGNOSIS — O09299 Supervision of pregnancy with other poor reproductive or obstetric history, unspecified trimester: Secondary | ICD-10-CM | POA: Insufficient documentation

## 2021-04-25 LAB — POCT URINE PREGNANCY: Preg Test, Ur: POSITIVE — AB

## 2021-04-25 NOTE — Progress Notes (Signed)
  Subjective:     Patient ID: Emily [name], female   DOB: 1991-12-12, 30 y.o.   MRN: [id number]  HPI Danika is a 30 year old white female,single, in for UPT, had +HPTs, had miscarriage in March and [facility] was <1 on 03/28/21, blood type is A+.No period since miscarriage.    Review of Systems +HPT Reviewed past medical,surgical, social and family history. Reviewed medications and allergies.     Objective:   Physical Exam BP 137/70 (BP Location: Left Arm, Patient Position: Sitting, Cuff Size: Normal)   Pulse 86   Ht 5\' 2"  (1.575 m)   Wt 146 lb (66.2 kg)   LMP  (LMP Unknown)   BMI 26.70 kg/m UPT +, Skin warm and dry. Neck: mid line trachea, normal thyroid, good ROM, no lymphadenopathy noted. Lungs: clear to ausculation bilaterally. Cardiovascular: regular rate and rhythm. AA is 0 Fall risk is low PHQ 9 score is 4 GAD 7 score is 4  [facility] - 04/25/21 1207      Pregnancy Intention Screening   Does the patient want to become pregnant in the next year? Unsure    Does the patient's partner want to become pregnant in the next year? Unsure    Would the patient like to discuss contraceptive options today? No      Contraception Wrap Up   Current Method Pregnant/Seeking Pregnancy    End Method Pregnant/Seeking Pregnancy    Contraception Counseling Provided No             Assessment:     1. Pregnancy test positive Take PNV Decrease smoking  2. Pregnancy, unspecified gestational age Check QHCG and progesterone today, will talk in am  3. History of miscarriage, currently pregnant     Plan:     Handout by [facility] given

## 2021-04-26 LAB — BETA HCG QUANT (REF LAB): hCG Quant: 1846 m[IU]/mL

## 2021-04-26 LAB — PROGESTERONE: Progesterone: 5.8 ng/mL

## 2021-04-28 DIAGNOSIS — Z419 Encounter for procedure for purposes other than remedying health state, unspecified: Secondary | ICD-10-CM | POA: Diagnosis not present

## 2021-04-29 ENCOUNTER — Telehealth: Payer: Self-pay

## 2021-04-29 ENCOUNTER — Other Ambulatory Visit: Payer: Self-pay | Admitting: Adult Health

## 2021-04-29 MED ORDER — PROGESTERONE 200 MG PO CAPS: ORAL_CAPSULE | ORAL | 3 refills | Status: DC

## 2021-04-29 NOTE — Telephone Encounter (Signed)
No answer @ 12:55 pm. JSY

## 2021-04-29 NOTE — Telephone Encounter (Signed)
No answer @ 5:29 pm. JSY

## 2021-04-29 NOTE — Telephone Encounter (Signed)
Pt has questions about her lab results and would like a call back  

## 2021-04-29 NOTE — Progress Notes (Signed)
Progesterone level 5.8 will rx Prometrium

## 2021-04-30 ENCOUNTER — Other Ambulatory Visit: Payer: Self-pay | Admitting: Adult Health

## 2021-04-30 DIAGNOSIS — Z3201 Encounter for pregnancy test, result positive: Secondary | ICD-10-CM | POA: Diagnosis not present

## 2021-04-30 NOTE — Progress Notes (Signed)
Ck QHCG today  

## 2021-05-01 LAB — BETA HCG QUANT (REF LAB): hCG Quant: 9409 m[IU]/mL

## 2021-05-01 NOTE — Telephone Encounter (Signed)
Pt aware of quant and progesterone results and has picked up the Prometrium. She has a dating [place] already scheduled. JSY

## 2021-05-06 ENCOUNTER — Other Ambulatory Visit: Payer: Self-pay | Admitting: Adult Health

## 2021-05-06 DIAGNOSIS — Z3201 Encounter for pregnancy test, result positive: Secondary | ICD-10-CM

## 2021-05-06 DIAGNOSIS — O09299 Supervision of pregnancy with other poor reproductive or obstetric history, unspecified trimester: Secondary | ICD-10-CM

## 2021-05-06 NOTE — Progress Notes (Signed)
Will recheck [facility]

## 2021-05-07 DIAGNOSIS — O09299 Supervision of pregnancy with other poor reproductive or obstetric history, unspecified trimester: Secondary | ICD-10-CM | POA: Diagnosis not present

## 2021-05-07 DIAGNOSIS — Z3201 Encounter for pregnancy test, result positive: Secondary | ICD-10-CM | POA: Diagnosis not present

## 2021-05-08 LAB — BETA HCG QUANT (REF LAB): hCG Quant: 41394 m[IU]/mL

## 2021-05-20 ENCOUNTER — Ambulatory Visit (INDEPENDENT_AMBULATORY_CARE_PROVIDER_SITE_OTHER): Payer: Self-pay | Admitting: Advanced Practice Midwife

## 2021-05-20 ENCOUNTER — Telehealth: Payer: Self-pay | Admitting: *Deleted

## 2021-05-20 ENCOUNTER — Other Ambulatory Visit: Payer: Self-pay

## 2021-05-20 ENCOUNTER — Encounter: Payer: Self-pay | Admitting: Advanced Practice Midwife

## 2021-05-20 VITALS — BP 106/64 | HR 68 | Ht 62.0 in | Wt 151.0 lb

## 2021-05-20 DIAGNOSIS — O3680X0 Pregnancy with inconclusive fetal viability, not applicable or unspecified: Secondary | ICD-10-CM

## 2021-05-20 DIAGNOSIS — Z8759 Personal history of other complications of pregnancy, childbirth and the puerperium: Secondary | ICD-10-CM
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
## 2021-05-29 ENCOUNTER — Other Ambulatory Visit: Payer: Self-pay | Admitting: Obstetrics & Gynecology

## 2021-05-29 DIAGNOSIS — Z419 Encounter for procedure for purposes other than remedying health state, unspecified: Secondary | ICD-10-CM | POA: Diagnosis not present

## 2021-05-29 DIAGNOSIS — O3680X0 Pregnancy with inconclusive fetal viability, not applicable or unspecified: Secondary | ICD-10-CM

## 2021-05-30 ENCOUNTER — Other Ambulatory Visit: Payer: Self-pay

## 2021-05-30 ENCOUNTER — Ambulatory Visit (INDEPENDENT_AMBULATORY_CARE_PROVIDER_SITE_OTHER): Payer: Medicaid Other

## 2021-05-30 DIAGNOSIS — O3680X0 Pregnancy with inconclusive fetal viability, not applicable or unspecified: Secondary | ICD-10-CM | POA: Diagnosis not present

## 2021-05-30 DIAGNOSIS — Z3A09 9 weeks gestation of pregnancy: Secondary | ICD-10-CM | POA: Diagnosis not present

## 2021-05-30 NOTE — Progress Notes (Signed)
[place] 9+4 wks,single IUP with ys,positive fht 176 bpm,normal ovaries,CRL 28.15 mm

## 2021-06-03 ENCOUNTER — Other Ambulatory Visit: Payer: Self-pay | Admitting: Adult Health

## 2021-06-03 MED ORDER — PROMETHAZINE HCL 25 MG PO TABS: 25.0000 mg | ORAL_TABLET | Freq: Four times a day (QID) | ORAL | 1 refills | Status: DC | PRN

## 2021-06-03 NOTE — Progress Notes (Signed)
Will rx phenergan  

## 2021-06-18 ENCOUNTER — Other Ambulatory Visit: Payer: Self-pay | Admitting: Obstetrics & Gynecology

## 2021-06-18 ENCOUNTER — Telehealth: Payer: Self-pay | Admitting: *Deleted

## 2021-06-18 DIAGNOSIS — Z3682 Encounter for antenatal screening for nuchal translucency: Secondary | ICD-10-CM

## 2021-06-18 NOTE — Telephone Encounter (Signed)
Patient called, very concerned because she don't feel right Worried she could be having another miscarriage, no bleeding but don't feel right Her initial/new ob visit is 6/30   Please advise & notify pt

## 2021-06-18 NOTE — Telephone Encounter (Signed)
Spoke with patient and offered her to come tomorrow for her NT/IT. Patient accepted. She denies any bleeding. Just feels like something is wrong. Advised we would take a look tomorrow. Pt appreciated this and had no other questions at this time.

## 2021-06-19 ENCOUNTER — Other Ambulatory Visit: Payer: Self-pay

## 2021-06-19 ENCOUNTER — Other Ambulatory Visit: Payer: Medicaid Other

## 2021-06-19 ENCOUNTER — Ambulatory Visit (INDEPENDENT_AMBULATORY_CARE_PROVIDER_SITE_OTHER): Payer: Medicaid Other

## 2021-06-19 DIAGNOSIS — Z3401 Encounter for supervision of normal first pregnancy, first trimester: Secondary | ICD-10-CM | POA: Diagnosis not present

## 2021-06-19 DIAGNOSIS — Z3A12 12 weeks gestation of pregnancy: Secondary | ICD-10-CM

## 2021-06-19 DIAGNOSIS — Z3682 Encounter for antenatal screening for nuchal translucency: Secondary | ICD-10-CM | POA: Diagnosis not present

## 2021-06-19 LAB — OB RESULTS CONSOLE RUBELLA ANTIBODY, IGM: Rubella: IMMUNE

## 2021-06-19 NOTE — Progress Notes (Signed)
US12+3 wks,measurements c/w dates,CRL 62.20 mm,FHR 154 bpm,anterior placenta,normal ovaries,NT 2 mm,NB present

## 2021-06-21 LAB — INTEGRATED 1
Crown Rump Length: 62.2 mm
Gest. Age on Collection Date: 12.4 weeks
Maternal Age at EDD: 32.2 yr
Nuchal Translucency (NT): 2 mm
Number of Fetuses: 1
PAPP-A Value: 303.2 ng/mL
Weight: 151 [lb_av]

## 2021-06-21 LAB — CBC/D/PLT+RPR+RH+ABO+RUBIGG...
Antibody Screen: NEGATIVE
Basophils Absolute: 0.1 10*3/uL (ref 0.0–0.2)
Basos: 0 %
EOS (ABSOLUTE): 0.2 10*3/uL (ref 0.0–0.4)
Eos: 2 %
HCV Ab: <0.1 s/co ratio (ref 0.0–0.9)
HIV Screen 4th Generation wRfx: NONREACTIVE
Hematocrit: 39.4 % (ref 34.0–46.6)
Hemoglobin: 13.1 g/dL (ref 11.1–15.9)
Hepatitis B Surface Ag: NEGATIVE
Immature Grans (Abs): 0 10*3/uL (ref 0.0–0.1)
Immature Granulocytes: 0 %
Lymphocytes Absolute: 2.4 10*3/uL (ref 0.7–3.1)
Lymphs: 21 %
MCH: 28.8 pg (ref 26.6–33.0)
MCHC: 33.2 g/dL (ref 31.5–35.7)
MCV: 87 fL (ref 79–97)
Monocytes Absolute: 0.8 10*3/uL (ref 0.1–0.9)
Monocytes: 7 %
Neutrophils Absolute: 7.8 10*3/uL — ABNORMAL HIGH (ref 1.4–7.0)
Neutrophils: 70 %
Platelets: 219 10*3/uL (ref 150–450)
RBC: 4.55 x10E6/uL (ref 3.77–5.28)
RDW: 13.6 % (ref 11.7–15.4)
RPR Ser Ql: NONREACTIVE
Rh Factor: POSITIVE
Rubella Antibodies, IGG: 1.29 index (ref >0.99)
WBC: 11.2 10*3/uL — ABNORMAL HIGH (ref 3.4–10.8)

## 2021-06-21 LAB — HCV INTERPRETATION

## 2021-06-24 ENCOUNTER — Telehealth: Payer: Self-pay | Admitting: *Deleted

## 2021-06-24 NOTE — Telephone Encounter (Signed)
Patient states she just got home and used the bathroom and noticed some bright red blood when she wiped.  Denies cramping, last intercourse was Friday.  Informed at this time, we would only do FHT check as there is not anything that can be done for the bleeding.  Offered for patient to come to office to do [facility] check but patient stated she has an appointment on Thursday and is ok waiting.  Advised to continue to monitor, refrain from intercourse for the next few days and if bleeding increased or developed abdominal pain, to call our office or go to the hospital.  Pt verbalized understanding and agreeable to plan.

## 2021-06-26 ENCOUNTER — Other Ambulatory Visit: Payer: Medicaid Other

## 2021-06-26 DIAGNOSIS — O0990 Supervision of high risk pregnancy, unspecified, unspecified trimester: Secondary | ICD-10-CM | POA: Insufficient documentation

## 2021-06-27 ENCOUNTER — Other Ambulatory Visit (HOSPITAL_COMMUNITY)
Admission: RE | Admit: 2021-06-27 | Discharge: 2021-06-27 | Disposition: A | Discharge status: Home / Self Care | Payer: Medicaid Other | Source: Ambulatory Visit | Attending: Advanced Practice Midwife | Admitting: Advanced Practice Midwife

## 2021-06-27 ENCOUNTER — Ambulatory Visit (INDEPENDENT_AMBULATORY_CARE_PROVIDER_SITE_OTHER): Payer: Medicaid Other | Admitting: Advanced Practice Midwife

## 2021-06-27 ENCOUNTER — Encounter: Payer: Self-pay | Admitting: Advanced Practice Midwife

## 2021-06-27 ENCOUNTER — Other Ambulatory Visit: Payer: Self-pay

## 2021-06-27 ENCOUNTER — Ambulatory Visit: Payer: Medicaid Other | Admitting: *Deleted

## 2021-06-27 VITALS — BP 113/70 | HR 68 | Wt 149.8 lb

## 2021-06-27 DIAGNOSIS — Z3480 Encounter for supervision of other normal pregnancy, unspecified trimester: Secondary | ICD-10-CM | POA: Insufficient documentation

## 2021-06-27 DIAGNOSIS — Z3A13 13 weeks gestation of pregnancy: Secondary | ICD-10-CM | POA: Insufficient documentation

## 2021-06-27 DIAGNOSIS — Z124 Encounter for screening for malignant neoplasm of cervix: Secondary | ICD-10-CM | POA: Insufficient documentation

## 2021-06-27 DIAGNOSIS — Z113 Encounter for screening for infections with a predominantly sexual mode of transmission: Secondary | ICD-10-CM

## 2021-06-27 LAB — POCT URINALYSIS DIPSTICK OB
Blood, UA: NEGATIVE
Glucose, UA: NEGATIVE
Ketones, UA: NEGATIVE
Leukocytes, UA: NEGATIVE
Nitrite, UA: NEGATIVE
POC,PROTEIN,UA: NEGATIVE

## 2021-06-27 MED ORDER — DOXYLAMINE-PYRIDOXINE 10-10 MG PO TBEC
DELAYED_RELEASE_TABLET | ORAL | 6 refills | Status: DC
Start: 2021-06-27 — End: 2021-12-21

## 2021-06-27 MED ORDER — BLOOD PRESSURE MONITOR MISC: 0 refills | Status: DC

## 2021-06-27 MED ORDER — ONDANSETRON 4 MG PO TBDP: 4.0000 mg | ORAL_TABLET | Freq: Four times a day (QID) | ORAL | 2 refills | Status: DC | PRN

## 2021-06-27 NOTE — Progress Notes (Addendum)
INITIAL OBSTETRICAL VISIT Patient name: Emily [name] MRN [id number]  Date of birth: 1991-07-18 Chief Complaint:   Initial Prenatal Visit (Bleeding last night)  History of Present Illness:   Emily [name] is a 30 y.o. G30P1021 Caucasian female at [redacted]w[redacted]d by LMP c/w u/s at 9 weeks with an Estimated Date of Delivery: 12/29/21 being seen today for her initial obstetrical visit.   Her obstetrical history is significant for  1 term SVD, 2 early SABs. Has a nexplanon in arm since 2011 .   Today she reports nausea.  Depression screen [facility] 2/9 06/27/2021 04/25/2021  Decreased Interest 1 1  Down, Depressed, Hopeless 0 1  PHQ - 2 Score 1 2  Altered sleeping 1 1  Tired, decreased energy 0 1  Change in appetite 2 0  Feeling bad or failure about yourself  0 0  Trouble concentrating 0 0  Moving slowly or fidgety/restless 0 0  Suicidal thoughts 0 0  PHQ-9 Score 4 4    No LMP recorded (lmp unknown). Patient is pregnant. Last pap unknown. Results were: normal Review of Systems:   Pertinent items are noted in HPI Denies cramping/contractions, leakage of fluid, vaginal bleeding, abnormal vaginal discharge w/ itching/odor/irritation, headaches, visual changes, shortness of breath, chest pain, abdominal pain, severe nausea/vomiting, or problems with urination or bowel movements unless otherwise stated above.  Pertinent History Reviewed:  Reviewed past medical,surgical, social, obstetrical and family history.  Reviewed problem list, medications and allergies. OB History  Gravida Para Term Preterm AB Living  4 1 1   2 1   SAB IAB Ectopic Multiple Live Births  2       1    # Outcome Date GA Lbr Len/2nd Weight Sex Delivery Anes PTL Lv  4 Current           3 SAB 02/2021          2 Term 01/24/07 [redacted]w[redacted]d  6 lb 8 oz (2.948 kg) M Vag-Spont EPI N LIV  1 SAB 2007           Physical Assessment:   Vitals:   06/27/21 1537  BP: 113/70  Pulse: 68  Weight: 149 lb 12.8 oz (67.9 kg)  Body mass index is 27.4  kg/m.       Physical Examination:  General appearance - well appearing, and in no distress  Mental status - alert, oriented to person, place, and time  Psych:  She has a normal mood and affect  Skin - warm and dry, normal color, no suspicious lesions noted  Chest - effort normal  Heart - normal rate and regular rhythm  Abdomen - soft, nontender  Extremities:  No swelling or varicosities noted  Pelvic - VULVA: normal appearing vulva with no masses, tenderness or lesions  VAGINA: normal appearing vagina with normal color and discharge, no lesions  CERVIX: normal appearing cervix without discharge or lesions, no CMT  Thin prep pap is done with HR HPV cotesting   Results for orders placed or performed in visit on 06/27/21 (from the past 24 hour(s))  POC Urinalysis Dipstick OB   Collection Time: 06/27/21  3:53 PM  Result Value Ref Range   Color, UA     Clarity, UA     Glucose, UA Negative Negative   Bilirubin, UA     Ketones, UA neg    Spec Grav, UA     Blood, UA neg    pH, UA     POC,PROTEIN,UA Negative Negative, Trace,  Small (1+), Moderate (2+), Large (3+), 4+   Urobilinogen, UA     Nitrite, UA neg    Leukocytes, UA Negative Negative   Appearance     Odor      Assessment & Plan:  1)  Low-Risk Pregnancy [id number] at [redacted]w[redacted]d with an Estimated Date of Delivery: 12/29/21   2) Initial OB visit 3)  Nexplanon in arm__no time today to remove, will schedule removal at next visit   Meds:  Meds ordered this encounter  Medications   Blood Pressure Monitor MISC    Sig: For regular home bp monitoring during pregnancy    Dispense:  1 each    Refill:  0    Z34.81 Please mail to patient   ondansetron (ZOFRAN ODT) 4 MG disintegrating tablet    Sig: Take 1 tablet (4 mg total) by mouth every 6 (six) hours as needed for nausea.    Dispense:  30 tablet    Refill:  2    Order Specific Question:   Supervising Provider    Answer:   Duane Lope H [[id number]]   Doxylamine-Pyridoxine (DICLEGIS)  10-10 MG TBEC    Sig: Take 2 qhs; may also take one in am and one in afternoon prn nausea    Dispense:  120 tablet    Refill:  6    Order Specific Question:   Supervising Provider    Answer:   Duane Lope H [[id number]]    Initial labs obtained Continue prenatal vitamins Reviewed n/v relief measures and warning s/s to report Reviewed recommended weight gain based on pre-gravid BMI Encouraged well-balanced diet Genetic & carrier screening discussed: requests Panorama, NT/IT, and Horizon , declines AFP Ultrasound discussed; fetal survey: requested CCNC completed> form faxed if has or is planning to apply for medicaid The nature of [facility] [employer] with multiple MDs and other Advanced Practice Providers was explained to patient; also emphasized that fellows, residents, and students are part of our team. Doesn't have a  home bp cuff. Rx . Check bp weekly, let us know if >140/90.        Scarlette Calico Cresenzo-Dishmon 4:16 PM

## 2021-06-27 NOTE — Patient Instructions (Signed)
Jill Poling Reininger, I greatly value your feedback.  If you receive a survey following your visit with [place] today, we appreciate you taking the time to fill it out.  Thanks, Cathie Beams, DNP, CNM  [facility] HAS MOVED!!! It is now [facility] at [facility] ([address] [place], [place] [address]) Entrance located off of [address] Free 24/7 valet parking   Nausea & Vomiting Have saltine crackers or pretzels by your bed and eat a few bites before you raise your head out of bed in the morning Eat small frequent meals throughout the day instead of large meals Drink plenty of fluids throughout the day to stay hydrated, just don't drink a lot of fluids with your meals.  This can make your stomach fill up faster making you feel sick Do not brush your teeth right after you eat Products with real ginger are good for nausea, like ginger ale and ginger hard candy Make sure it says made with real ginger! Sucking on sour candy like lemon heads is also good for nausea If your prenatal vitamins make you nauseated, take them at night so you will sleep through the nausea Sea Bands If you feel like you need medicine for the nausea & vomiting please let us know If you are unable to keep any fluids or food down please let us know   Constipation Drink plenty of fluid, preferably water, throughout the day Eat foods high in fiber such as fruits, vegetables, and grains Exercise, such as walking, is a good way to keep your bowels regular Drink warm fluids, especially warm prune juice, or decaf coffee Eat a 1/2 cup of real oatmeal (not instant), 1/2 cup applesauce, and 1/2-1 cup warm prune juice every day If needed, you may take Colace (docusate sodium) stool softener once or twice a day to help keep the stool soft.  If you still are having problems with constipation, you may take Miralax once daily as needed to help keep your bowels regular.   Home Blood Pressure Monitoring for  Patients   Your provider has recommended that you check your blood pressure (BP) at least once a week at home. If you do not have a blood pressure cuff at home, one will be provided for you. Contact your provider if you have not received your monitor within 1 week.   Helpful Tips for Accurate Home Blood Pressure Checks  Don't smoke, exercise, or drink caffeine 30 minutes before checking your BP Use the restroom before checking your BP (a full bladder can raise your pressure) Relax in a comfortable upright chair Feet on the ground Left arm resting comfortably on a flat surface at the level of your heart Legs uncrossed Back supported Sit quietly and don't talk Place the cuff on your bare arm Adjust snuggly, so that only two fingertips can fit between your skin and the top of the cuff Check 2 readings separated by at least one minute Keep a log of your BP readings For a visual, please reference this diagram: http://ccnc.care/bpdiagram  Provider Name: [facility]     Phone: [phone]  Zone 1: ALL CLEAR  Continue to monitor your symptoms:  BP reading is less than 140 (top number) or less than 90 (bottom number)  No right upper stomach pain No headaches or seeing spots No feeling nauseated or throwing up No swelling in face and hands  Zone 2: CAUTION Call your doctor's office for any of the following:  BP reading is greater than 140 (top number) or greater than 90 (bottom number)  Stomach pain under your ribs in the middle or right side Headaches or seeing spots Feeling nauseated or throwing up Swelling in face and hands  Zone 3: EMERGENCY  Seek immediate medical care if you have any of the following:  BP reading is greater than160 (top number) or greater than 110 (bottom number) Severe headaches not improving with Tylenol Serious difficulty catching your breath Any worsening symptoms from Zone 2    First Trimester of Pregnancy The first trimester of pregnancy is from  week 1 until the end of week 12 (months 1 through 3). A week after a sperm fertilizes an egg, the egg will implant on the wall of the uterus. This embryo will begin to develop into a baby. Genes from you and your partner are forming the baby. The female genes determine whether the baby is a boy or a girl. At 6-8 weeks, the eyes and face are formed, and the heartbeat can be seen on ultrasound. At the end of 12 weeks, all the baby's organs are formed.  Now that you are pregnant, you will want to do everything you can to have a healthy baby. Two of the most important things are to get good prenatal care and to follow your health care provider's instructions. Prenatal care is all the medical care you receive before the baby's birth. This care will help prevent, find, and treat any problems during the pregnancy and childbirth. BODY CHANGES Your body goes through many changes during pregnancy. The changes vary from woman to woman.  You may gain or lose a couple of pounds at first. You may feel sick to your stomach (nauseous) and throw up (vomit). If the vomiting is uncontrollable, call your health care provider. You may tire easily. You may develop headaches that can be relieved by medicines approved by your health care provider. You may urinate more often. Painful urination may mean you have a bladder infection. You may develop heartburn as a result of your pregnancy. You may develop constipation because certain hormones are causing the muscles that push waste through your intestines to slow down. You may develop hemorrhoids or swollen, bulging veins (varicose veins). Your breasts may begin to grow larger and become tender. Your nipples may stick out more, and the tissue that surrounds them (areola) may become darker. Your gums may bleed and may be sensitive to brushing and flossing. Dark spots or blotches (chloasma, mask of pregnancy) may develop on your face. This will likely fade after the baby is  born. Your menstrual periods will stop. You may have a loss of appetite. You may develop cravings for certain kinds of food. You may have changes in your emotions from day to day, such as being excited to be pregnant or being concerned that something may go wrong with the pregnancy and baby. You may have more vivid and strange dreams. You may have changes in your hair. These can include thickening of your hair, rapid growth, and changes in texture. Some women also have hair loss during or after pregnancy, or hair that feels dry or thin. Your hair will most likely return to normal after your baby is born. WHAT TO EXPECT AT YOUR PRENATAL VISITS During a routine prenatal visit: You will be weighed to make sure you and the baby are growing normally. Your blood pressure will be taken. Your abdomen will be measured to track your baby's growth. The fetal  heartbeat will be listened to starting around week 10 or 12 of your pregnancy. Test results from any previous visits will be discussed. Your health care provider may ask you: How you are feeling. If you are feeling the baby move. If you have had any abnormal symptoms, such as leaking fluid, bleeding, severe headaches, or abdominal cramping. If you have any questions. Other tests that may be performed during your first trimester include: Blood tests to find your blood type and to check for the presence of any previous infections. They will also be used to check for low iron levels (anemia) and Rh antibodies. Later in the pregnancy, blood tests for diabetes will be done along with other tests if problems develop. Urine tests to check for infections, diabetes, or protein in the urine. An ultrasound to confirm the proper growth and development of the baby. An amniocentesis to check for possible genetic problems. Fetal screens for spina bifida and Down syndrome. You may need other tests to make sure you and the baby are doing well. HOME CARE  INSTRUCTIONS  Medicines Follow your health care provider's instructions regarding medicine use. Specific medicines may be either safe or unsafe to take during pregnancy. Take your prenatal vitamins as directed. If you develop constipation, try taking a stool softener if your health care provider approves. Diet Eat regular, well-balanced meals. Choose a variety of foods, such as meat or vegetable-based protein, fish, milk and low-fat dairy products, vegetables, fruits, and whole grain breads and cereals. Your health care provider will help you determine the amount of weight gain that is right for you. Avoid raw meat and uncooked cheese. These carry germs that can cause birth defects in the baby. Eating four or five small meals rather than three large meals a day may help relieve nausea and vomiting. If you start to feel nauseous, eating a few soda crackers can be helpful. Drinking liquids between meals instead of during meals also seems to help nausea and vomiting. If you develop constipation, eat more high-fiber foods, such as fresh vegetables or fruit and whole grains. Drink enough fluids to keep your urine clear or pale yellow. Activity and Exercise Exercise only as directed by your health care provider. Exercising will help you: Control your weight. Stay in shape. Be prepared for labor and delivery. Experiencing pain or cramping in the lower abdomen or low back is a good sign that you should stop exercising. Check with your health care provider before continuing normal exercises. Try to avoid standing for long periods of time. Move your legs often if you must stand in one place for a long time. Avoid heavy lifting. Wear low-heeled shoes, and practice good posture. You may continue to have sex unless your health care provider directs you otherwise. Relief of Pain or Discomfort Wear a good support bra for breast tenderness.   Take warm sitz baths to soothe any pain or discomfort caused by  hemorrhoids. Use hemorrhoid cream if your health care provider approves.   Rest with your legs elevated if you have leg cramps or low back pain. If you develop varicose veins in your legs, wear support hose. Elevate your feet for 15 minutes, 3-4 times a day. Limit salt in your diet. Prenatal Care Schedule your prenatal visits by the twelfth week of pregnancy. They are usually scheduled monthly at first, then more often in the last 2 months before delivery. Write down your questions. Take them to your prenatal visits. Keep all your prenatal visits as directed  by your health care provider. Safety Wear your seat belt at all times when driving. Make a list of emergency phone numbers, including numbers for family, friends, the hospital, and police and fire departments. General Tips Ask your health care provider for a referral to a local prenatal education class. Begin classes no later than at the beginning of month 6 of your pregnancy. Ask for help if you have counseling or nutritional needs during pregnancy. Your health care provider can offer advice or refer you to specialists for help with various needs. Do not use hot tubs, steam rooms, or saunas. Do not douche or use tampons or scented sanitary pads. Do not cross your legs for long periods of time. Avoid cat litter boxes and soil used by cats. These carry germs that can cause birth defects in the baby and possibly loss of the fetus by miscarriage or stillbirth. Avoid all smoking, herbs, alcohol, and medicines not prescribed by your health care provider. Chemicals in these affect the formation and growth of the baby. Schedule a dentist appointment. At home, brush your teeth with a soft toothbrush and be gentle when you floss. SEEK MEDICAL CARE IF:  You have dizziness. You have mild pelvic cramps, pelvic pressure, or nagging pain in the abdominal area. You have persistent nausea, vomiting, or diarrhea. You have a bad smelling vaginal  discharge. You have pain with urination. You notice increased swelling in your face, hands, legs, or ankles. SEEK IMMEDIATE MEDICAL CARE IF:  You have a fever. You are leaking fluid from your vagina. You have spotting or bleeding from your vagina. You have severe abdominal cramping or pain. You have rapid weight gain or loss. You vomit blood or material that looks like coffee grounds. You are exposed to [place] measles and have never had them. You are exposed to fifth disease or chickenpox. You develop a severe headache. You have shortness of breath. You have any kind of trauma, such as from a fall or a car accident. Document Released: 12/09/2001 Document Revised: 05/01/2014 Document Reviewed: 10/25/2013 [facility] Patient Information 2015 [place], [place]. This information is not intended to replace advice given to you by your health care provider. Make sure you discuss any questions you have with your health care provider.  Coronavirus (COVID-19) Are you at risk?  Are you at risk for the Coronavirus (COVID-19)?  To be considered HIGH RISK for Coronavirus (COVID-19), you have to meet the following criteria:  Traveled to [place], [place], [place], [place] or [place];  and have fever, cough, and shortness of breath within the last 2 weeks of travel OR Been in close contact with a person diagnosed with COVID-19 within the last 2 weeks and have fever, cough, and shortness of breath IF YOU DO NOT MEET THESE CRITERIA, YOU ARE CONSIDERED LOW RISK FOR COVID-19.  What to do if you are HIGH RISK for COVID-19?  If you are having a medical emergency, call 911. Seek medical care right away. Before you go to a doctor's office, urgent care or emergency department, call ahead and tell them about your recent travel, contact with someone diagnosed with COVID-19, and your symptoms. You should receive instructions from your physician's office regarding next steps of care.  When you arrive at healthcare provider,  tell the healthcare staff immediately you have returned from visiting [place], [place], [place], [place] or [place]; in the last two weeks or you have been in close contact with a person diagnosed with COVID-19 in the last 2 weeks.  Tell the health care staff about your symptoms: fever, cough and shortness of breath. After you have been seen by a medical provider, you will be either: Tested for (COVID-19) and discharged home on quarantine except to seek medical care if symptoms worsen, and asked to  Stay home and avoid contact with others until you get your results (4-5 days)  Avoid travel on public transportation if possible (such as bus, train, or airplane) or Sent to the Emergency Department by EMS for evaluation, COVID-19 testing, and possible admission depending on your condition and test results.  What to do if you are LOW RISK for COVID-19?  Reduce your risk of any infection by using the same precautions used for avoiding the common cold or flu:  Wash your hands often with soap and warm water for at least 20 seconds.  If soap and water are not readily available, use an alcohol-based hand sanitizer with at least 60% alcohol.  If coughing or sneezing, cover your mouth and nose by coughing or sneezing into the elbow areas of your shirt or coat, into a tissue or into your sleeve (not your hands). Avoid shaking hands with others and consider head nods or verbal greetings only. Avoid touching your eyes, nose, or mouth with unwashed hands.  Avoid close contact with people who are sick. Avoid places or events with large numbers of people in one location, like concerts or sporting events. Carefully consider travel plans you have or are making. If you are planning any travel outside or inside the [place], visit the CDC's Travelers' Health webpage for the latest health notices. If you have some symptoms but not all symptoms, continue to monitor at home and seek medical attention if your symptoms worsen. If  you are having a medical emergency, call 911.   ADDITIONAL HEALTHCARE OPTIONS FOR [place] / e-Visit: [email or web address]         [facility]: [facility]: [id number]                   [facility]: [phone]     Safe Medications in Pregnancy   Acne: Benzoyl Peroxide Salicylic Acid  Backache/Headache: Tylenol: 2 regular strength every 4 hours OR              2 Extra strength every 6 hours  Colds/Coughs/Allergies: Benadryl (alcohol free) 25 mg every 6 hours as needed Breath right strips Claritin Cepacol throat lozenges Chloraseptic throat spray Cold-Eeze- up to three times per day Cough drops, alcohol free Flonase (by prescription only) Guaifenesin Mucinex Robitussin DM (plain only, alcohol free) Saline nasal spray/drops Sudafed (pseudoephedrine) & Actifed ** use only after [redacted] weeks gestation and if you do not have high blood pressure Tylenol Vicks Vaporub Zinc lozenges Zyrtec   Constipation: Colace Ducolax suppositories Fleet enema Glycerin suppositories Metamucil Milk of magnesia Miralax Senokot Smooth move tea  Diarrhea: Kaopectate Imodium A-D  *NO pepto Bismol  Hemorrhoids: Anusol Anusol HC Preparation H Tucks  Indigestion: Tums Maalox Mylanta Zantac  Pepcid  Insomnia: Benadryl (alcohol free) 36m every 6 hours as needed Tylenol PM Unisom, no Gelcaps  Leg Cramps: Tums MagGel  Nausea/Vomiting:  Bonine Dramamine Emetrol Ginger extract Sea bands Meclizine  Nausea medication to take during pregnancy:  Unisom (doxylamine succinate 25 mg tablets) Take one tablet daily at bedtime. If symptoms are not adequately controlled, the dose can be increased to a maximum recommended dose of two tablets daily (1/2 tablet in  the morning, 1/2 tablet mid-afternoon and one at bedtime). Vitamin B6 160m tablets. Take one  tablet twice a day (up to 200 mg per day).  Skin Rashes: Aveeno products Benadryl cream or 250mevery 6 hours as needed Calamine Lotion 1% cortisone cream  Yeast infection: Gyne-lotrimin 7 Monistat 7   **If taking multiple medications, please check labels to avoid duplicating the same active ingredients **take medication as directed on the label ** Do not exceed 4000 mg of tylenol in 24 hours **Do not take medications that contain aspirin or ibuprofen

## 2021-06-28 DIAGNOSIS — Z419 Encounter for procedure for purposes other than remedying health state, unspecified: Secondary | ICD-10-CM | POA: Diagnosis not present

## 2021-06-28 LAB — MED LIST OPTION NOT SELECTED

## 2021-06-29 LAB — PMP SCREEN PROFILE (10S), URINE
Amphetamine Scrn, Ur: NEGATIVE ng/mL
BARBITURATE SCREEN URINE: NEGATIVE ng/mL
BENZODIAZEPINE SCREEN, URINE: NEGATIVE ng/mL
CANNABINOIDS UR QL SCN: POSITIVE ng/mL — AB
Cocaine (Metab) Scrn, Ur: NEGATIVE ng/mL
Creatinine(Crt), U: 84.4 mg/dL (ref 20.0–300.0)
Methadone Screen, Urine: NEGATIVE ng/mL
OXYCODONE+OXYMORPHONE UR QL SCN: NEGATIVE ng/mL
Opiate Scrn, Ur: NEGATIVE ng/mL
Ph of Urine: 6.3 (ref 4.5–8.9)
Phencyclidine Qn, Ur: NEGATIVE ng/mL
Propoxyphene Scrn, Ur: NEGATIVE ng/mL

## 2021-06-29 LAB — URINE CULTURE

## 2021-07-05 LAB — CYTOLOGY - PAP
Chlamydia: NEGATIVE
Comment: NEGATIVE
Comment: NEGATIVE
Comment: NORMAL
Diagnosis: NEGATIVE
High risk HPV: NEGATIVE
Neisseria Gonorrhea: NEGATIVE

## 2021-07-09 ENCOUNTER — Encounter: Payer: Self-pay | Admitting: Advanced Practice Midwife

## 2021-07-15 ENCOUNTER — Encounter: Payer: Self-pay | Admitting: Women's Health

## 2021-07-15 DIAGNOSIS — O285 Abnormal chromosomal and genetic finding on antenatal screening of mother: Secondary | ICD-10-CM | POA: Insufficient documentation

## 2021-07-16 ENCOUNTER — Encounter: Payer: Self-pay | Admitting: *Deleted

## 2021-07-29 DIAGNOSIS — Z419 Encounter for procedure for purposes other than remedying health state, unspecified: Secondary | ICD-10-CM | POA: Diagnosis not present

## 2021-07-30 ENCOUNTER — Other Ambulatory Visit: Payer: Self-pay | Admitting: Advanced Practice Midwife

## 2021-07-30 DIAGNOSIS — Z363 Encounter for antenatal screening for malformations: Secondary | ICD-10-CM

## 2021-08-01 ENCOUNTER — Ambulatory Visit (INDEPENDENT_AMBULATORY_CARE_PROVIDER_SITE_OTHER): Payer: Medicaid Other

## 2021-08-01 ENCOUNTER — Other Ambulatory Visit: Payer: Self-pay

## 2021-08-01 ENCOUNTER — Encounter: Payer: Self-pay | Admitting: Women's Health

## 2021-08-01 ENCOUNTER — Ambulatory Visit (INDEPENDENT_AMBULATORY_CARE_PROVIDER_SITE_OTHER): Payer: Medicaid Other | Admitting: Women's Health

## 2021-08-01 VITALS — BP 119/71 | HR 68 | Wt 157.0 lb

## 2021-08-01 DIAGNOSIS — Z363 Encounter for antenatal screening for malformations: Secondary | ICD-10-CM | POA: Diagnosis not present

## 2021-08-01 DIAGNOSIS — Z3482 Encounter for supervision of other normal pregnancy, second trimester: Secondary | ICD-10-CM

## 2021-08-01 DIAGNOSIS — Z3A18 18 weeks gestation of pregnancy: Secondary | ICD-10-CM

## 2021-08-01 DIAGNOSIS — O285 Abnormal chromosomal and genetic finding on antenatal screening of mother: Secondary | ICD-10-CM

## 2021-08-01 DIAGNOSIS — Z3480 Encounter for supervision of other normal pregnancy, unspecified trimester: Secondary | ICD-10-CM

## 2021-08-01 NOTE — Progress Notes (Signed)
[place] 18+4 wks,breech,anterior placenta gr 0,normal ovaries,cx 4.3 cm,SVP of fluid 4 cm,FHR 135 bpm,EFW 261 g 61%,anatomy complete,no obvious abnormalities

## 2021-08-01 NOTE — Patient Instructions (Signed)
Emily [name], thank you for choosing our office today! We appreciate the opportunity to meet your healthcare needs. You may receive a short survey by mail, e-mail, or through MyChart. If you are happy with your care we would appreciate if you could take just a few minutes to complete the survey questions. We read all of your comments and take your feedback very seriously. Thank you again for choosing our office.  [facility] Team at [facility] ([address] [place], [place] [address]) Entrance C, located off of [address] Free 24/7 valet parking  Go to [email or web address] to register for FREE online childbirth classes  Call the office ([phone]) or go to [facility] if: You begin to severe cramping Your water breaks.  Sometimes it is a big gush of fluid, sometimes it is just a trickle that keeps getting your panties wet or running down your legs You have vaginal bleeding.  It is normal to have a small amount of spotting if your cervix was checked.   [place] Pediatricians/Family Doctors [facility] ([facility]): [address], [phone]           [facility]: [address], [phone]                [facility] ([facility]): [address], [phone] (call to ask if accepting patients) [facility]: [address], [place], [phone]    [place] Pediatricians/Family Doctors [facility] ([facility]): [address], [phone] [facility]: [address], [phone] [facility]: [address], [phone]  [place] [facility]  [facility] ([facility]): [phone] [facility]: [address], [phone]   [place] [facility] Doctors [facility]: [address], [phone]  [place] [facility]  [facility]: [address], [phone]  Home Blood Pressure Monitoring for Patients   Your provider has recommended that you check your blood pressure (BP) at least once a week at home. If you do not have a blood pressure cuff at home, one will be provided for you. Contact your provider if you have not received your monitor within 1 week.   Helpful Tips for Accurate Home Blood Pressure Checks  Don't smoke, exercise, or drink caffeine 30 minutes before checking your BP Use the restroom before checking your BP (a full bladder can raise your pressure) Relax in a comfortable upright chair Feet on the ground Left arm resting comfortably on a flat surface at the level of your heart Legs uncrossed Back supported Sit quietly and don't talk Place the cuff on your bare arm Adjust snuggly, so that only two fingertips can fit between your skin and the top of the cuff Check 2 readings separated by at least one minute Keep a log of your BP readings For a visual, please reference this diagram: http://[facility].care/bpdiagram  Provider Name: [facility]     Phone: [phone]  Zone 1: ALL CLEAR  Continue to monitor your symptoms:  BP reading is less than 140 (top number) or less than 90 (bottom number)  No right upper stomach pain No headaches or seeing spots No feeling nauseated or throwing up No swelling in face and hands  Zone 2: CAUTION Call your doctor's office for any of the following:  BP reading is greater than 140 (top number) or greater than   90 (bottom number)  Stomach pain under your ribs in the middle or right side Headaches or seeing spots Feeling nauseated or throwing up Swelling in face and hands  Zone 3: EMERGENCY  Seek immediate medical care if you have any of the following:  BP reading is greater than160 (top number) or greater than 110 (bottom number) Severe headaches not improving with Tylenol Serious difficulty catching your breath Any worsening symptoms from  Zone 2     Second Trimester of Pregnancy The second trimester is from week 14 through week 27 (months 4 through 6). The second trimester is often a time when you feel your best. Your body has adjusted to being pregnant, and you begin to feel better physically. Usually, morning sickness has lessened or quit completely, you may have more energy, and you may have an increase in appetite. The second trimester is also a time when the fetus is growing rapidly. At the end of the sixth month, the fetus is about 9 inches long and weighs about 1 pounds. You will likely begin to feel the baby move (quickening) between 16 and 20 weeks of pregnancy. Body changes during your second trimester Your body continues to go through many changes during your second trimester. The changes vary from woman to woman. Your weight will continue to increase. You will notice your lower abdomen bulging out. You may begin to get stretch marks on your hips, abdomen, and breasts. You may develop headaches that can be relieved by medicines. The medicines should be approved by your health care provider. You may urinate more often because the fetus is pressing on your bladder. You may develop or continue to have heartburn as a result of your pregnancy. You may develop constipation because certain hormones are causing the muscles that push waste through your intestines to slow down. You may develop hemorrhoids or swollen, bulging veins (varicose veins). You may have back pain. This is caused by: Weight gain. Pregnancy hormones that are relaxing the joints in your pelvis. A shift in weight and the muscles that support your balance. Your breasts will continue to grow and they will continue to become tender. Your gums may bleed and may be sensitive to brushing and flossing. Dark spots or blotches (chloasma, mask of pregnancy) may develop on your face. This will likely fade after the baby is born. A dark line from your belly button to  the pubic area (linea nigra) may appear. This will likely fade after the baby is born. You may have changes in your hair. These can include thickening of your hair, rapid growth, and changes in texture. Some women also have hair loss during or after pregnancy, or hair that feels dry or thin. Your hair will most likely return to normal after your baby is born.  What to expect at prenatal visits During a routine prenatal visit: You will be weighed to make sure you and the fetus are growing normally. Your blood pressure will be taken. Your abdomen will be measured to track your baby's growth. The fetal heartbeat will be listened to. Any test results from the previous visit will be discussed.  Your health care provider may ask you: How you are feeling. If you are feeling the baby move. If you have had any abnormal symptoms, such as leaking fluid, bleeding, severe headaches, or abdominal cramping. If you are using any tobacco products, including cigarettes, chewing tobacco, and electronic cigarettes. If you have any questions.  Other tests that may be performed during   your second trimester include: Blood tests that check for: Low iron levels (anemia). High blood sugar that affects pregnant women (gestational diabetes) between 24 and 28 weeks. Rh antibodies. This is to check for a protein on red blood cells (Rh factor). Urine tests to check for infections, diabetes, or protein in the urine. An ultrasound to confirm the proper growth and development of the baby. An amniocentesis to check for possible genetic problems. Fetal screens for spina bifida and Down syndrome. HIV (human immunodeficiency virus) testing. Routine prenatal testing includes screening for HIV, unless you choose not to have this test.  Follow these instructions at home: Medicines Follow your health care provider's instructions regarding medicine use. Specific medicines may be either safe or unsafe to take during  pregnancy. Take a prenatal vitamin that contains at least 600 micrograms (mcg) of folic acid. If you develop constipation, try taking a stool softener if your health care provider approves. Eating and drinking Eat a balanced diet that includes fresh fruits and vegetables, whole grains, good sources of protein such as meat, eggs, or tofu, and low-fat dairy. Your health care provider will help you determine the amount of weight gain that is right for you. Avoid raw meat and uncooked cheese. These carry germs that can cause birth defects in the baby. If you have low calcium intake from food, talk to your health care provider about whether you should take a daily calcium supplement. Limit foods that are high in fat and processed sugars, such as fried and sweet foods. To prevent constipation: Drink enough fluid to keep your urine clear or pale yellow. Eat foods that are high in fiber, such as fresh fruits and vegetables, whole grains, and beans. Activity Exercise only as directed by your health care provider. Most women can continue their usual exercise routine during pregnancy. Try to exercise for 30 minutes at least 5 days a week. Stop exercising if you experience uterine contractions. Avoid heavy lifting, wear low heel shoes, and practice good posture. A sexual relationship may be continued unless your health care provider directs you otherwise. Relieving pain and discomfort Wear a good support bra to prevent discomfort from breast tenderness. Take warm sitz baths to soothe any pain or discomfort caused by hemorrhoids. Use hemorrhoid cream if your health care provider approves. Rest with your legs elevated if you have leg cramps or low back pain. If you develop varicose veins, wear support hose. Elevate your feet for 15 minutes, 3-4 times a day. Limit salt in your diet. Prenatal Care Write down your questions. Take them to your prenatal visits. Keep all your prenatal visits as told by your health  care provider. This is important. Safety Wear your seat belt at all times when driving. Make a list of emergency phone numbers, including numbers for family, friends, the hospital, and police and fire departments. General instructions Ask your health care provider for a referral to a local prenatal education class. Begin classes no later than the beginning of month 6 of your pregnancy. Ask for help if you have counseling or nutritional needs during pregnancy. Your health care provider can offer advice or refer you to specialists for help with various needs. Do not use hot tubs, steam rooms, or saunas. Do not douche or use tampons or scented sanitary pads. Do not cross your legs for long periods of time. Avoid cat litter boxes and soil used by cats. These carry germs that can cause birth defects in the baby and possibly loss of the   fetus by miscarriage or stillbirth. Avoid all smoking, herbs, alcohol, and unprescribed drugs. Chemicals in these products can affect the formation and growth of the baby. Do not use any products that contain nicotine or tobacco, such as cigarettes and e-cigarettes. If you need help quitting, ask your health care provider. Visit your dentist if you have not gone yet during your pregnancy. Use a soft toothbrush to brush your teeth and be gentle when you floss. Contact a health care provider if: You have dizziness. You have mild pelvic cramps, pelvic pressure, or nagging pain in the abdominal area. You have persistent nausea, vomiting, or diarrhea. You have a bad smelling vaginal discharge. You have pain when you urinate. Get help right away if: You have a fever. You are leaking fluid from your vagina. You have spotting or bleeding from your vagina. You have severe abdominal cramping or pain. You have rapid weight gain or weight loss. You have shortness of breath with chest pain. You notice sudden or extreme swelling of your face, hands, ankles, feet, or legs. You  have not felt your baby move in over an hour. You have severe headaches that do not go away when you take medicine. You have vision changes. Summary The second trimester is from week 14 through week 27 (months 4 through 6). It is also a time when the fetus is growing rapidly. Your body goes through many changes during pregnancy. The changes vary from woman to woman. Avoid all smoking, herbs, alcohol, and unprescribed drugs. These chemicals affect the formation and growth your baby. Do not use any tobacco products, such as cigarettes, chewing tobacco, and e-cigarettes. If you need help quitting, ask your health care provider. Contact your health care provider if you have any questions. Keep all prenatal visits as told by your health care provider. This is important. This information is not intended to replace advice given to you by your health care provider. Make sure you discuss any questions you have with your health care provider. Document Released: 12/09/2001 Document Revised: 05/22/2016 Document Reviewed: 02/15/2013 Elsevier Interactive Patient Education  2017 [employer]  

## 2021-08-01 NOTE — Progress Notes (Addendum)
LOW-RISK PREGNANCY VISIT Patient name: Emily [name] MRN [id number]  Date of birth: May 07, 1991 Chief Complaint:   Routine Prenatal Visit (Anatomy scan)  History of Present Illness:   Emily [name] is a 30 y.o. G46P1021 female at [redacted]w[redacted]d with an Estimated Date of Delivery: 12/29/21 being seen today for ongoing management of a low-risk pregnancy.   Today she reports  dry patches bilateral inner thights . Contractions: Not present. Vag. Bleeding: None.   . denies leaking of fluid.  Depression screen [facility] 2/9 06/27/2021 04/25/2021  Decreased Interest 1 1  Down, Depressed, Hopeless 0 1  PHQ - 2 Score 1 2  Altered sleeping 1 1  Tired, decreased energy 0 1  Change in appetite 2 0  Feeling bad or failure about yourself  0 0  Trouble concentrating 0 0  Moving slowly or fidgety/restless 0 0  Suicidal thoughts 0 0  PHQ-9 Score 4 4     GAD 7 : Generalized Anxiety Score 06/27/2021 04/25/2021  Nervous, Anxious, on Edge 1 1  Control/stop worrying 1 1  Worry too much - different things 1 0  Trouble relaxing 1 0  Restless 0 0  Easily annoyed or irritable 0 1  Afraid - awful might happen 1 1  Total GAD 7 Score 5 4      Review of Systems:   Pertinent items are noted in HPI Denies abnormal vaginal discharge w/ itching/odor/irritation, headaches, visual changes, shortness of breath, chest pain, abdominal pain, severe nausea/vomiting, or problems with urination or bowel movements unless otherwise stated above. Pertinent History Reviewed:  Reviewed past medical,surgical, social, obstetrical and family history.  Reviewed problem list, medications and allergies. Physical Assessment:   Vitals:   08/01/21 1027  BP: 119/71  Pulse: 68  Weight: 157 lb (71.2 kg)  Body mass index is 28.72 kg/m.        Physical Examination:   General appearance: Well appearing, and in no distress  Mental status: Alert, oriented to person, place, and time  Skin: Warm & dry, small dry scaly patch Lt inner  thigh  Cardiovascular: Normal heart rate noted  Respiratory: Normal respiratory effort, no distress  Abdomen: Soft, gravid, nontender  Pelvic: Cervical exam deferred         Extremities: Edema: None  Fetal Status:         [place] 18+4 wks,breech,anterior placenta gr 0,normal ovaries,cx 4.3 cm,SVP of fluid 4 cm,FHR 135 bpm,EFW 261 g 61%,anatomy complete,no obvious abnormalities  Chaperone: N/A   No results found for this or any previous visit (from the past 24 hour(s)).  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  Labs/procedures today: U/S and 2nd IT  Plan:  Continue routine obstetrical care  Next visit: prefers in person    Reviewed: Preterm labor symptoms and general obstetric precautions including but not limited to vaginal bleeding, contractions, leaking of fluid and fetal movement were reviewed in detail with the patient.  All questions were answered. Does have home bp cuff. Office bp cuff given: not applicable. Check bp weekly, let us know if consistently >140 and/or >90.  Follow-up: Return in about 4 weeks (around 08/29/2021)  for LROB, CNM, in person.  Future Appointments  Date Time Provider Department Center  08/01/2021 11:10 AM Cheral Marker, CNM [facility]  08/29/2021  2:30 PM Cheral Marker, CNM [facility]    Orders Placed This Encounter  Procedures   INTEGRATED 2   Cheral Marker CNM, [facility] 08/01/2021 11:02 AM

## 2021-08-02 ENCOUNTER — Encounter: Payer: Self-pay | Admitting: Advanced Practice Midwife

## 2021-08-03 LAB — INTEGRATED 2
AFP MoM: 2.1
Alpha-Fetoprotein: 96.1 ng/mL
Crown Rump Length: 62.2 mm
DIA MoM: 2.37
DIA Value: 366.3 pg/mL
Estriol, Unconjugated: 2.33 ng/mL
Gest. Age on Collection Date: 12.4 weeks
Gestational Age: 18.6 weeks
Maternal Age at EDD: 32.2 yr
Nuchal Translucency (NT): 2 mm
Nuchal Translucency MoM: 1.44
Number of Fetuses: 1
PAPP-A MoM: 0.32
PAPP-A Value: 303.2 ng/mL
Test Results:: NEGATIVE
Weight: 151 [lb_av]
Weight: 154 [lb_av]
hCG MoM: 0.67
hCG Value: 16.6 IU/mL
uE3 MoM: 1.37

## 2021-08-08 DIAGNOSIS — Z3481 Encounter for supervision of other normal pregnancy, first trimester: Secondary | ICD-10-CM | POA: Diagnosis not present

## 2021-08-20 ENCOUNTER — Other Ambulatory Visit: Payer: Self-pay | Admitting: Women's Health

## 2021-08-20 MED ORDER — TERCONAZOLE 0.4 % VA CREA: TOPICAL_CREAM | VAGINAL | 0 refills | Status: DC

## 2021-08-29 ENCOUNTER — Encounter: Payer: Self-pay | Admitting: Women's Health

## 2021-08-29 ENCOUNTER — Other Ambulatory Visit: Payer: Self-pay

## 2021-08-29 ENCOUNTER — Ambulatory Visit (INDEPENDENT_AMBULATORY_CARE_PROVIDER_SITE_OTHER): Payer: Medicaid Other | Admitting: Women's Health

## 2021-08-29 VITALS — BP 124/73 | HR 74 | Wt 161.0 lb

## 2021-08-29 DIAGNOSIS — Z3482 Encounter for supervision of other normal pregnancy, second trimester: Secondary | ICD-10-CM

## 2021-08-29 DIAGNOSIS — O285 Abnormal chromosomal and genetic finding on antenatal screening of mother: Secondary | ICD-10-CM

## 2021-08-29 DIAGNOSIS — Z419 Encounter for procedure for purposes other than remedying health state, unspecified: Secondary | ICD-10-CM | POA: Diagnosis not present

## 2021-08-29 NOTE — Progress Notes (Signed)
    LOW-RISK PREGNANCY VISIT Patient name: Emily [name] MRN [id number]  Date of birth: [birth year] Chief Complaint:   Routine Prenatal Visit  History of Present Illness:   Emily [name] is a 30 y.o. G24P1021 female at [redacted]w[redacted]d with an Estimated Date of Delivery: 12/29/21 being seen today for ongoing management of a low-risk pregnancy.   Today she reports no complaints. Contractions: Not present.  .  Movement: Present. denies leaking of fluid.  Depression screen [facility] 2/9 06/27/2021 04/25/2021  Decreased Interest 1 1  Down, Depressed, Hopeless 0 1  PHQ - 2 Score 1 2  Altered sleeping 1 1  Tired, decreased energy 0 1  Change in appetite 2 0  Feeling bad or failure about yourself  0 0  Trouble concentrating 0 0  Moving slowly or fidgety/restless 0 0  Suicidal thoughts 0 0  PHQ-9 Score 4 4     GAD 7 : Generalized Anxiety Score 06/27/2021 04/25/2021  Nervous, Anxious, on Edge 1 1  Control/stop worrying 1 1  Worry too much - different things 1 0  Trouble relaxing 1 0  Restless 0 0  Easily annoyed or irritable 0 1  Afraid - awful might happen 1 1  Total GAD 7 Score 5 4      Review of Systems:   Pertinent items are noted in HPI Denies abnormal vaginal discharge w/ itching/odor/irritation, headaches, visual changes, shortness of breath, chest pain, abdominal pain, severe nausea/vomiting, or problems with urination or bowel movements unless otherwise stated above. Pertinent History Reviewed:  Reviewed past medical,surgical, social, obstetrical and family history.  Reviewed problem list, medications and allergies. Physical Assessment:   Vitals:   08/29/21 1447  BP: 124/73  Pulse: 74  Weight: 161 lb (73 kg)  Body mass index is 29.45 kg/m.        Physical Examination:   General appearance: Well appearing, and in no distress  Mental status: Alert, oriented to person, place, and time  Skin: Warm & dry  Cardiovascular: Normal heart rate noted  Respiratory: Normal respiratory effort,  no distress  Abdomen: Soft, gravid, nontender  Pelvic: Cervical exam deferred         Extremities: Edema: None  Fetal Status: Fetal Heart Rate (bpm): 144 Fundal Height: 23 cm Movement: Present    Chaperone: N/A   No results found for this or any previous visit (from the past 24 hour(s)).  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  Labs/procedures today:  wants flu shot next visit  Plan:  Continue routine obstetrical care  Next visit: prefers will be in person for pn2     Reviewed: Preterm labor symptoms and general obstetric precautions including but not limited to vaginal bleeding, contractions, leaking of fluid and fetal movement were reviewed in detail with the patient.  All questions were answered. Does have home bp cuff. Office bp cuff given: not applicable. Check bp weekly, let us know if consistently >140 and/or >90.  Follow-up: Return in about 4 weeks (around 09/26/2021) for LROB, PN2, CNM, in person.  No future appointments.  No orders of the defined types were placed in this encounter.  Cheral Marker CNM, [facility] 08/29/2021 3:03 PM

## 2021-08-29 NOTE — Patient Instructions (Signed)
Sullivan, thank you for choosing our office today! We appreciate the opportunity to meet your healthcare needs. You may receive a short survey by mail, e-mail, or through [employer]. If you are happy with your care we would appreciate if you could take just a few minutes to complete the survey questions. We read all of your comments and take your feedback very seriously. Thank you again for choosing our office.  Center for [employer] Team at [facility]  [facility] at [facility] ([address] [place], [place] [address]) Entrance C, located off of [address] Free 24/7 valet parking   You will have your sugar test next visit.  Please do not eat or drink anything after midnight the night before you come, not even water.  You will be here for at least two hours.  Please make an appointment online for the bloodwork at [email or web address] for 8:00am (or as close to this as possible). Make sure you select the [facility] service center.   CLASSES: Go to [email or web address] to register for classes (childbirth, breastfeeding, waterbirth, infant CPR, daddy bootcamp, etc.)  Call the office [phone]) or go to [facility] if: You begin to have strong, frequent contractions Your water breaks.  Sometimes it is a big gush of fluid, sometimes it is just a trickle that keeps getting your panties wet or running down your legs You have vaginal bleeding.  It is normal to have a small amount of spotting if your cervix was checked.  You don't feel your baby moving like normal.  If you don't, get you something to eat and drink and lay down and focus on feeling your baby move.   If your baby is still not moving like normal, you should call the office or go to [facility]  Call the office [phone]) or go to [facility] hospital for these signs of pre-eclampsia: Severe headache that does not go away with Tylenol Visual changes- seeing spots, double, blurred vision Pain under your right breast or upper  abdomen that does not go away with Tums or heartburn medicine Nausea and/or vomiting Severe swelling in your hands, feet, and face    [facility] Pediatricians/Family Doctors [facility]): [address] Colette Ribas, [phone]           [facility]: [address], [phone]                [facility]): [address], [phone] (call to ask if accepting patients) [facility]: [address], [place], [phone]    [facility] Pediatricians/Family Doctors [facility]): [address], [phone] [facility]: [address] [place], [phone] [facility]: [address], [phone]  [facility] Doctors  [facility]): [phone] [facility]: [address] [phone]   [facility] Doctors [facility]: [address] [phone]  [facility] Doctors  [facility]: [phone], [phone]  Home Blood Pressure Monitoring for Patients   Your provider has recommended that you check your blood pressure (BP) at least once a week at home. If you do not have a blood pressure cuff at home, one will be provided for you. Contact your provider if you have not received your monitor within 1 week.   Helpful Tips for Accurate Home Blood Pressure Checks  Don't smoke, exercise, or drink  caffeine 30 minutes before checking your BP Use the restroom before checking your BP (a full bladder can raise your pressure) Relax in a comfortable upright chair Feet on the ground Left arm resting comfortably on a flat surface at the level of your heart Legs uncrossed Back supported Sit quietly and don't talk Place the cuff on your bare arm Adjust snuggly, so that only two fingertips can fit between your skin and the top of the cuff Check 2  readings separated by at least one minute Keep a log of your BP readings For a visual, please reference this diagram: http://ccnc.care/bpdiagram  Provider Name: [facility]     Phone: [phone]  Zone 1: ALL CLEAR  Continue to monitor your symptoms:  BP reading is less than 140 (top number) or less than 90 (bottom number)  No right upper stomach pain No headaches or seeing spots No feeling nauseated or throwing up No swelling in face and hands  Zone 2: CAUTION Call your doctor's office for any of the following:  BP reading is greater than 140 (top number) or greater than 90 (bottom number)  Stomach pain under your ribs in the middle or right side Headaches or seeing spots Feeling nauseated or throwing up Swelling in face and hands  Zone 3: EMERGENCY  Seek immediate medical care if you have any of the following:  BP reading is greater than160 (top number) or greater than 110 (bottom number) Severe headaches not improving with Tylenol Serious difficulty catching your breath Any worsening symptoms from Zone 2   Second Trimester of Pregnancy The second trimester is from week 13 through week 28, months 4 through 6. The second trimester is often a time when you feel your best. Your body has also adjusted to being pregnant, and you begin to feel better physically. Usually, morning sickness has lessened or quit completely, you may have more energy, and you may have an increase in appetite. The second trimester is also a time when the fetus is growing rapidly. At the end of the sixth month, the fetus is about 9 inches long and weighs about 1 pounds. You will likely begin to feel the baby move (quickening) between 18 and 20 weeks of the pregnancy. BODY CHANGES Your body goes through many changes during pregnancy. The changes vary from woman to woman.  Your weight will continue to increase. You will notice your lower abdomen bulging out. You may begin to get stretch marks on your  hips, abdomen, and breasts. You may develop headaches that can be relieved by medicines approved by your health care provider. You may urinate more often because the fetus is pressing on your bladder. You may develop or continue to have heartburn as a result of your pregnancy. You may develop constipation because certain hormones are causing the muscles that push waste through your intestines to slow down. You may develop hemorrhoids or swollen, bulging veins (varicose veins). You may have back pain because of the weight gain and pregnancy hormones relaxing your joints between the bones in your pelvis and as a result of a shift in weight and the muscles that support your balance. Your breasts will continue to grow and be tender. Your gums may bleed and may be sensitive to brushing and flossing. Dark spots or blotches (chloasma, mask of pregnancy) may develop on your face. This will likely fade after the baby is born. A dark line from your belly button to the pubic area (linea nigra) may appear. This  will likely fade after the baby is born. You may have changes in your hair. These can include thickening of your hair, rapid growth, and changes in texture. Some women also have hair loss during or after pregnancy, or hair that feels dry or thin. Your hair will most likely return to normal after your baby is born. WHAT TO EXPECT AT YOUR PRENATAL VISITS During a routine prenatal visit: You will be weighed to make sure you and the fetus are growing normally. Your blood pressure will be taken. Your abdomen will be measured to track your baby's growth. The fetal heartbeat will be listened to. Any test results from the previous visit will be discussed. Your health care provider may ask you: How you are feeling. If you are feeling the baby move. If you have had any abnormal symptoms, such as leaking fluid, bleeding, severe headaches, or abdominal cramping. If you have any questions. Other tests that may  be performed during your second trimester include: Blood tests that check for: Low iron levels (anemia). Gestational diabetes (between 24 and 28 weeks). Rh antibodies. Urine tests to check for infections, diabetes, or protein in the urine. An ultrasound to confirm the proper growth and development of the baby. An amniocentesis to check for possible genetic problems. Fetal screens for spina bifida and Down syndrome. HOME CARE INSTRUCTIONS  Avoid all smoking, herbs, alcohol, and unprescribed drugs. These chemicals affect the formation and growth of the baby. Follow your health care provider's instructions regarding medicine use. There are medicines that are either safe or unsafe to take during pregnancy. Exercise only as directed by your health care provider. Experiencing uterine cramps is a good sign to stop exercising. Continue to eat regular, healthy meals. Wear a good support bra for breast tenderness. Do not use hot tubs, steam rooms, or saunas. Wear your seat belt at all times when driving. Avoid raw meat, uncooked cheese, cat litter boxes, and soil used by cats. These carry germs that can cause birth defects in the baby. Take your prenatal vitamins. Try taking a stool softener (if your health care provider approves) if you develop constipation. Eat more high-fiber foods, such as fresh vegetables or fruit and whole grains. Drink plenty of fluids to keep your urine clear or pale yellow. Take warm sitz baths to soothe any pain or discomfort caused by hemorrhoids. Use hemorrhoid cream if your health care provider approves. If you develop varicose veins, wear support hose. Elevate your feet for 15 minutes, 3-4 times a day. Limit salt in your diet. Avoid heavy lifting, wear low heel shoes, and practice good posture. Rest with your legs elevated if you have leg cramps or low back pain. Visit your dentist if you have not gone yet during your pregnancy. Use a soft toothbrush to brush your teeth  and be gentle when you floss. A sexual relationship may be continued unless your health care provider directs you otherwise. Continue to go to all your prenatal visits as directed by your health care provider. SEEK MEDICAL CARE IF:  You have dizziness. You have mild pelvic cramps, pelvic pressure, or nagging pain in the abdominal area. You have persistent nausea, vomiting, or diarrhea. You have a bad smelling vaginal discharge. You have pain with urination. SEEK IMMEDIATE MEDICAL CARE IF:  You have a fever. You are leaking fluid from your vagina. You have spotting or bleeding from your vagina. You have severe abdominal cramping or pain. You have rapid weight gain or loss. You have shortness of   breath with chest pain. You notice sudden or extreme swelling of your face, hands, ankles, feet, or legs. You have not felt your baby move in over an hour. You have severe headaches that do not go away with medicine. You have vision changes. Document Released: 12/09/2001 Document Revised: 12/20/2013 Document Reviewed: 02/15/2013 ExitCare Patient Information 2015 [employer]. This information is not intended to replace advice given to you by your health care provider. Make sure you discuss any questions you have with your health care provider.  

## 2021-09-28 DIAGNOSIS — Z419 Encounter for procedure for purposes other than remedying health state, unspecified: Secondary | ICD-10-CM | POA: Diagnosis not present

## 2021-10-01 ENCOUNTER — Other Ambulatory Visit: Payer: Medicaid Other

## 2021-10-01 ENCOUNTER — Other Ambulatory Visit: Payer: Self-pay

## 2021-10-01 ENCOUNTER — Encounter: Payer: Self-pay | Admitting: Women's Health

## 2021-10-01 ENCOUNTER — Ambulatory Visit (INDEPENDENT_AMBULATORY_CARE_PROVIDER_SITE_OTHER): Payer: Medicaid Other | Admitting: Women's Health

## 2021-10-01 VITALS — BP 130/75 | HR 76 | Wt 165.8 lb

## 2021-10-01 DIAGNOSIS — Z23 Encounter for immunization: Secondary | ICD-10-CM

## 2021-10-01 DIAGNOSIS — Z3A27 27 weeks gestation of pregnancy: Secondary | ICD-10-CM

## 2021-10-01 DIAGNOSIS — Z3482 Encounter for supervision of other normal pregnancy, second trimester: Secondary | ICD-10-CM

## 2021-10-01 DIAGNOSIS — Z3480 Encounter for supervision of other normal pregnancy, unspecified trimester: Secondary | ICD-10-CM

## 2021-10-01 NOTE — Progress Notes (Signed)
LOW-RISK PREGNANCY VISIT Patient name: Emily [name] MRN [id number]  Date of birth: Dec 22, 1991 Chief Complaint:   Routine Prenatal Visit  History of Present Illness:   Emily [name] is a 30 y.o. G63P1021 female at [redacted]w[redacted]d with an Estimated Date of Delivery: 12/29/21 being seen today for ongoing management of a low-risk pregnancy.   Today she reports no complaints. Contractions: Not present. Vag. Bleeding: None.  Movement: Present. denies leaking of fluid.  Depression screen [facility] 2/9 10/01/2021 06/27/2021 04/25/2021  Decreased Interest 1 1 1   Down, Depressed, Hopeless 0 0 1  PHQ - 2 Score 1 1 2   Altered sleeping 1 1 1   Tired, decreased energy 1 0 1  Change in appetite 1 2 0  Feeling bad or failure about yourself  0 0 0  Trouble concentrating 0 0 0  Moving slowly or fidgety/restless 0 0 0  Suicidal thoughts 0 0 0  PHQ-9 Score 4 4 4      GAD 7 : Generalized Anxiety Score 10/01/2021 06/27/2021 04/25/2021  Nervous, Anxious, on Edge 0 1 1  Control/stop worrying 1 1 1   Worry too much - different things 1 1 0  Trouble relaxing 1 1 0  Restless 0 0 0  Easily annoyed or irritable 0 0 1  Afraid - awful might happen 1 1 1   Total GAD 7 Score 4 5 4       Review of Systems:   Pertinent items are noted in HPI Denies abnormal vaginal discharge w/ itching/odor/irritation, headaches, visual changes, shortness of breath, chest pain, abdominal pain, severe nausea/vomiting, or problems with urination or bowel movements unless otherwise stated above. Pertinent History Reviewed:  Reviewed past medical,surgical, social, obstetrical and family history.  Reviewed problem list, medications and allergies. Physical Assessment:   Vitals:   10/01/21 0902  BP: 130/75  Pulse: 76  Weight: 165 lb 12.8 oz (75.2 kg)  Body mass index is 30.33 kg/m.        Physical Examination:   General appearance: Well appearing, and in no distress  Mental status: Alert, oriented to person, place, and time  Skin: Warm &  dry  Cardiovascular: Normal heart rate noted  Respiratory: Normal respiratory effort, no distress  Abdomen: Soft, gravid, nontender  Pelvic: Cervical exam deferred         Extremities: Edema: Trace  Fetal Status: Fetal Heart Rate (bpm): 138 Fundal Height: 28 cm Movement: Present    Chaperone: N/A   No results found for this or any previous visit (from the past 24 hour(s)).  Assessment & Plan:  1) Low-risk pregnancy G4P1021 at [redacted]w[redacted]d with an Estimated Date of Delivery: 12/29/21    Meds: No orders of the defined types were placed in this encounter.  Labs/procedures today: flu shot, tdap, and PN2  Plan:  Continue routine obstetrical care  Next visit: prefers in person    Reviewed: Preterm labor symptoms and general obstetric precautions including but not limited to vaginal bleeding, contractions, leaking of fluid and fetal movement were reviewed in detail with the patient.  All questions were answered. Does have home bp cuff. Office bp cuff given: not applicable. Check bp weekly, let 04/27/2021 know if consistently >140 and/or >90.  Follow-up: Return in about 4 weeks (around 10/29/2021) for LROB, CNM, in person.  No future appointments.  Orders Placed This Encounter  Procedures   Tdap vaccine greater than or equal to 7yo IM   Flu Vaccine QUAD 104mo+IM (Fluarix, Fluzone & Alfiuria Quad PF)    Mady Gemma CNM, WHNP-BC 10/01/2021 9:17 AM

## 2021-10-01 NOTE — Patient Instructions (Signed)
Emily [name], thank you for choosing our office today! We appreciate the opportunity to meet your healthcare needs. You may receive a short survey by mail, e-mail, or through MyChart. If you are happy with your care we would appreciate if you could take just a few minutes to complete the survey questions. We read all of your comments and take your feedback very seriously. Thank you again for choosing our office.  [facility] ([address] [place], [place] [address]) Entrance C, located off of [address] Free 24/7 valet parking   CLASSES: Go to [email or web address] to register for classes (childbirth, breastfeeding, waterbirth, infant CPR, daddy bootcamp, etc.)  Call the office ([phone]) or go to [facility] if: You begin to have strong, frequent contractions Your water breaks.  Sometimes it is a big gush of fluid, sometimes it is just a trickle that keeps getting your panties wet or running down your legs You have vaginal bleeding.  It is normal to have a small amount of spotting if your cervix was checked.  You don't feel your baby moving like normal.  If you don't, get you something to eat and drink and lay down and focus on feeling your baby move.   If your baby is still not moving like normal, you should call the office or go to [facility].  Call the office ([phone]) or go to [facility] for these signs of pre-eclampsia: Severe headache that does not go away with Tylenol Visual changes- seeing spots, double, blurred vision Pain under your right breast or upper abdomen that does not go away with Tums or heartburn medicine Nausea and/or vomiting Severe swelling in your hands, feet, and face   Tdap Vaccine It is recommended that you get the Tdap vaccine during the third trimester of EACH pregnancy to help protect your baby from getting pertussis (whooping cough) 27-36 weeks is the BEST time to do  this so that you can pass the protection on to your baby. During pregnancy is better than after pregnancy, but if you are unable to get it during pregnancy it will be offered at the hospital.  You can get this vaccine with us, at the health department, your family doctor, or some local pharmacies Everyone who will be around your baby should also be up-to-date on their vaccines before the baby comes. Adults (who are not pregnant) only need 1 dose of Tdap during adulthood.   [place] Pediatricians/Family Doctors [facility] ([facility]): [address], [phone]           [facility]: [address], [phone]                [facility] ([facility]): [address], [phone] (call to ask if accepting patients) [facility]: [address], [place], [phone]    [place] Pediatricians/Family Doctors [facility] ([facility]): [address], [phone] [facility]: [address], [phone] [facility]: [address], [phone]  [place] [facility]  [facility] ([facility]): [phone] [facility]: [address], [phone]   [place] [facility]: [address], [phone]  [place] [facility]  [facility]: [address], [phone]  Home Blood Pressure Monitoring for Patients   Your provider has recommended that you check your   blood pressure (BP) at least once a week at home. If you do not have a blood pressure cuff at home, one will be provided for you. Contact your provider if you have not received your monitor within 1 week.   Helpful Tips for Accurate Home Blood Pressure Checks  Don't smoke, exercise, or drink caffeine 30 minutes before checking your BP Use the restroom before checking your BP (a full bladder can raise your  pressure) Relax in a comfortable upright chair Feet on the ground Left arm resting comfortably on a flat surface at the level of your heart Legs uncrossed Back supported Sit quietly and don't talk Place the cuff on your bare arm Adjust snuggly, so that only two fingertips can fit between your skin and the top of the cuff Check 2 readings separated by at least one minute Keep a log of your BP readings For a visual, please reference this diagram: http://ccnc.care/bpdiagram  Provider Name: [facility]     Phone: [phone]  Zone 1: ALL CLEAR  Continue to monitor your symptoms:  BP reading is less than 140 (top number) or less than 90 (bottom number)  No right upper stomach pain No headaches or seeing spots No feeling nauseated or throwing up No swelling in face and hands  Zone 2: CAUTION Call your doctor's office for any of the following:  BP reading is greater than 140 (top number) or greater than 90 (bottom number)  Stomach pain under your ribs in the middle or right side Headaches or seeing spots Feeling nauseated or throwing up Swelling in face and hands  Zone 3: EMERGENCY  Seek immediate medical care if you have any of the following:  BP reading is greater than160 (top number) or greater than 110 (bottom number) Severe headaches not improving with Tylenol Serious difficulty catching your breath Any worsening symptoms from Zone 2   Third Trimester of Pregnancy The third trimester is from week 29 through week 42, months 7 through 9. The third trimester is a time when the fetus is growing rapidly. At the end of the ninth month, the fetus is about 20 inches in length and weighs 6-10 pounds.  BODY CHANGES Your body goes through many changes during pregnancy. The changes vary from woman to woman.  Your weight will continue to increase. You can expect to gain 25-35 pounds (11-16 kg) by the end of the pregnancy. You may begin to get stretch marks on your hips, abdomen,  and breasts. You may urinate more often because the fetus is moving lower into your pelvis and pressing on your bladder. You may develop or continue to have heartburn as a result of your pregnancy. You may develop constipation because certain hormones are causing the muscles that push waste through your intestines to slow down. You may develop hemorrhoids or swollen, bulging veins (varicose veins). You may have pelvic pain because of the weight gain and pregnancy hormones relaxing your joints between the bones in your pelvis. Backaches may result from overexertion of the muscles supporting your posture. You may have changes in your hair. These can include thickening of your hair, rapid growth, and changes in texture. Some women also have hair loss during or after pregnancy, or hair that feels dry or thin. Your hair will most likely return to normal after your baby is born. Your breasts will continue to grow and be tender. A yellow discharge may leak from your breasts called colostrum. Your belly button may stick out. You may   feel short of breath because of your expanding uterus. You may notice the fetus "dropping," or moving lower in your abdomen. You may have a bloody mucus discharge. This usually occurs a few days to a week before labor begins. Your cervix becomes thin and soft (effaced) near your due date. WHAT TO EXPECT AT YOUR PRENATAL EXAMS  You will have prenatal exams every 2 weeks until week 36. Then, you will have weekly prenatal exams. During a routine prenatal visit: You will be weighed to make sure you and the fetus are growing normally. Your blood pressure is taken. Your abdomen will be measured to track your baby's growth. The fetal heartbeat will be listened to. Any test results from the previous visit will be discussed. You may have a cervical check near your due date to see if you have effaced. At around 36 weeks, your caregiver will check your cervix. At the same time, your  caregiver will also perform a test on the secretions of the vaginal tissue. This test is to determine if a type of bacteria, Group B streptococcus, is present. Your caregiver will explain this further. Your caregiver may ask you: What your birth plan is. How you are feeling. If you are feeling the baby move. If you have had any abnormal symptoms, such as leaking fluid, bleeding, severe headaches, or abdominal cramping. If you have any questions. Other tests or screenings that may be performed during your third trimester include: Blood tests that check for low iron levels (anemia). Fetal testing to check the health, activity level, and growth of the fetus. Testing is done if you have certain medical conditions or if there are problems during the pregnancy. FALSE LABOR You may feel small, irregular contractions that eventually go away. These are called Braxton Hicks contractions, or false labor. Contractions may last for hours, days, or even weeks before true labor sets in. If contractions come at regular intervals, intensify, or become painful, it is best to be seen by your caregiver.  SIGNS OF LABOR  Menstrual-like cramps. Contractions that are 5 minutes apart or less. Contractions that start on the top of the uterus and spread down to the lower abdomen and back. A sense of increased pelvic pressure or back pain. A watery or bloody mucus discharge that comes from the vagina. If you have any of these signs before the 37th week of pregnancy, call your caregiver right away. You need to go to the hospital to get checked immediately. HOME CARE INSTRUCTIONS  Avoid all smoking, herbs, alcohol, and unprescribed drugs. These chemicals affect the formation and growth of the baby. Follow your caregiver's instructions regarding medicine use. There are medicines that are either safe or unsafe to take during pregnancy. Exercise only as directed by your caregiver. Experiencing uterine cramps is a good sign to  stop exercising. Continue to eat regular, healthy meals. Wear a good support bra for breast tenderness. Do not use hot tubs, steam rooms, or saunas. Wear your seat belt at all times when driving. Avoid raw meat, uncooked cheese, cat litter boxes, and soil used by cats. These carry germs that can cause birth defects in the baby. Take your prenatal vitamins. Try taking a stool softener (if your caregiver approves) if you develop constipation. Eat more high-fiber foods, such as fresh vegetables or fruit and whole grains. Drink plenty of fluids to keep your urine clear or pale yellow. Take warm sitz baths to soothe any pain or discomfort caused by hemorrhoids. Use hemorrhoid cream if   your caregiver approves. If you develop varicose veins, wear support hose. Elevate your feet for 15 minutes, 3-4 times a day. Limit salt in your diet. Avoid heavy lifting, wear low heal shoes, and practice good posture. Rest a lot with your legs elevated if you have leg cramps or low back pain. Visit your dentist if you have not gone during your pregnancy. Use a soft toothbrush to brush your teeth and be gentle when you floss. A sexual relationship may be continued unless your caregiver directs you otherwise. Do not travel far distances unless it is absolutely necessary and only with the approval of your caregiver. Take prenatal classes to understand, practice, and ask questions about the labor and delivery. Make a trial run to the hospital. Pack your hospital bag. Prepare the baby's nursery. Continue to go to all your prenatal visits as directed by your caregiver. SEEK MEDICAL CARE IF: You are unsure if you are in labor or if your water has broken. You have dizziness. You have mild pelvic cramps, pelvic pressure, or nagging pain in your abdominal area. You have persistent nausea, vomiting, or diarrhea. You have a bad smelling vaginal discharge. You have pain with urination. SEEK IMMEDIATE MEDICAL CARE IF:  You  have a fever. You are leaking fluid from your vagina. You have spotting or bleeding from your vagina. You have severe abdominal cramping or pain. You have rapid weight loss or gain. You have shortness of breath with chest pain. You notice sudden or extreme swelling of your face, hands, ankles, feet, or legs. You have not felt your baby move in over an hour. You have severe headaches that do not go away with medicine. You have vision changes. Document Released: 12/09/2001 Document Revised: 12/20/2013 Document Reviewed: 02/15/2013 ExitCare Patient Information 2015 [employer]. This information is not intended to replace advice given to you by your health care provider. Make sure you discuss any questions you have with your health care provider.       

## 2021-10-05 LAB — CBC
Hematocrit: 36.8 % (ref 34.0–46.6)
Hemoglobin: 12.1 g/dL (ref 11.1–15.9)
MCH: 28.7 pg (ref 26.6–33.0)
MCHC: 32.9 g/dL (ref 31.5–35.7)
MCV: 87 fL (ref 79–97)
Platelets: 239 10*3/uL (ref 150–450)
RBC: 4.22 x10E6/uL (ref 3.77–5.28)
RDW: 13.5 % (ref 11.7–15.4)
WBC: 12.1 10*3/uL — ABNORMAL HIGH (ref 3.4–10.8)

## 2021-10-05 LAB — ANTIBODY SCREEN: Antibody Screen: NEGATIVE

## 2021-10-05 LAB — GLUCOSE TOLERANCE, 2 HOURS W/ 1HR
Glucose, 1 hour: 225 mg/dL — ABNORMAL HIGH (ref 65–179)
Glucose, 2 hour: 136 mg/dL (ref 65–152)

## 2021-10-05 LAB — HIV ANTIBODY (ROUTINE TESTING W REFLEX): HIV Screen 4th Generation wRfx: NONREACTIVE

## 2021-10-05 LAB — RPR: RPR Ser Ql: NONREACTIVE

## 2021-10-07 ENCOUNTER — Other Ambulatory Visit: Payer: Self-pay | Admitting: *Deleted

## 2021-10-07 ENCOUNTER — Other Ambulatory Visit: Payer: Self-pay | Admitting: Women's Health

## 2021-10-07 ENCOUNTER — Encounter: Payer: Self-pay | Admitting: Women's Health

## 2021-10-07 DIAGNOSIS — O24410 Gestational diabetes mellitus in pregnancy, diet controlled: Secondary | ICD-10-CM | POA: Insufficient documentation

## 2021-10-07 DIAGNOSIS — Z8632 Personal history of gestational diabetes: Secondary | ICD-10-CM | POA: Insufficient documentation

## 2021-10-07 DIAGNOSIS — O24419 Gestational diabetes mellitus in pregnancy, unspecified control: Secondary | ICD-10-CM

## 2021-10-07 MED ORDER — ACCU-CHEK GUIDE VI STRP: ORAL_STRIP | 12 refills | Status: DC

## 2021-10-07 MED ORDER — ACCU-CHEK GUIDE W/DEVICE KIT: 1.0000 | PACK | Freq: Four times a day (QID) | 0 refills | Status: DC

## 2021-10-07 MED ORDER — ASPIRIN 81 MG PO TBEC: 81.0000 mg | DELAYED_RELEASE_TABLET | Freq: Every day | ORAL | 3 refills | Status: DC

## 2021-10-07 MED ORDER — ACCU-CHEK SOFTCLIX LANCETS MISC: 12 refills | Status: DC

## 2021-10-22 ENCOUNTER — Other Ambulatory Visit: Payer: Medicaid Other

## 2021-10-24 ENCOUNTER — Encounter: Payer: Self-pay | Admitting: Emergency Medicine

## 2021-10-24 ENCOUNTER — Ambulatory Visit
Admission: EM | Admit: 2021-10-24 | Discharge: 2021-10-24 | Disposition: A | Discharge status: Home / Self Care | Payer: Medicaid Other | Attending: Physician Assistant | Admitting: Physician Assistant

## 2021-10-24 ENCOUNTER — Other Ambulatory Visit: Payer: Self-pay

## 2021-10-24 DIAGNOSIS — J069 Acute upper respiratory infection, unspecified: Secondary | ICD-10-CM

## 2021-10-24 NOTE — ED Provider Notes (Signed)
[facility]    CSN: [id number] Arrival date & time: 10/24/21  5449      History   Chief Complaint Chief Complaint  Patient presents with   Nasal Congestion    HPI DEMRI POULTON is a 30 y.o. female.   The history is provided by the patient. No language interpreter was used.  Cough Cough characteristics:  Non-productive Sputum characteristics:  Nondescript Severity:  Moderate Onset quality:  Gradual Duration:  4 days Timing:  Constant Progression:  Worsening Chronicity:  New Smoker: no   Relieved by:  Nothing Worsened by:  Nothing Ineffective treatments:  None tried Associated symptoms: rhinorrhea    History reviewed. No pertinent past medical history.  Patient Active Problem List   Diagnosis Date Noted   Gestational diabetes mellitus, class A1 10/07/2021   Abnormal chromosomal and genetic finding on antenatal screening mother 07/15/2021   Supervision of high risk pregnancy, antepartum 06/26/2021   History of miscarriage, currently pregnant 04/25/2021    Past Surgical History:  Procedure Laterality Date   DILATION AND CURETTAGE OF UTERUS      OB History     Gravida  4   Para  1   Term  1   Preterm      AB  2   Living  1      SAB  2   IAB      Ectopic      Multiple      Live Births  1            Home Medications    Prior to Admission medications   Medication Sig Start Date End Date Taking? Authorizing Provider  acetaminophen (TYLENOL) 500 MG tablet Take 1,000 mg by mouth every 6 (six) hours as needed for moderate pain.   Yes [provider]  aspirin 81 MG EC tablet Take 1 tablet (81 mg total) by mouth daily. Swallow whole. 10/07/21   Roma Schanz, CNM  Accu-Chek Softclix Lancets lancets Check blood sugar four times a day. 10/07/21   Roma Schanz, CNM  Blood Glucose Monitoring Suppl (ACCU-CHEK GUIDE) w/Device KIT 1 kit by Does not apply route 4 (four) times daily. 10/07/21   Roma Schanz,  CNM  Blood Pressure Monitor MISC For regular home bp monitoring during pregnancy Patient not taking: No sig reported 06/27/21   Cresenzo-Dishmon, Joaquim Lai, CNM  Doxylamine-Pyridoxine (DICLEGIS) 10-10 MG TBEC Take 2 qhs; may also take one in am and one in afternoon prn nausea 06/27/21   Cresenzo-Dishmon, Joaquim Lai, CNM  glucose blood (ACCU-CHEK GUIDE) test strip Check blood sugar four times daily. 10/07/21   Roma Schanz, CNM  ondansetron (ZOFRAN ODT) 4 MG disintegrating tablet Take 1 tablet (4 mg total) by mouth every 6 (six) hours as needed for nausea. 06/27/21   Cresenzo-Dishmon, Joaquim Lai, CNM  Prenatal Vit-Fe Fumarate-FA (PRENATAL VITAMIN PO) Take by mouth.    [provider]  promethazine (PHENERGAN) 25 MG tablet Take 1 tablet (25 mg total) by mouth every 6 (six) hours as needed for nausea or vomiting. 06/03/21   Estill Dooms, NP    Family History Family History  Problem Relation Age of Onset   Healthy Mother    Healthy Father    Diabetes Maternal Grandfather     Social History Social History   Tobacco Use   Smoking status: Former    Packs/day: 1.00    Types: Cigarettes   Smokeless tobacco: Never  Vaping Use   Vaping Use:  Never used  Substance Use Topics   Alcohol use: Not Currently   Drug use: No     Allergies   Patient has no known allergies.   Review of Systems Review of Systems  HENT:  Positive for rhinorrhea.   Respiratory:  Positive for cough.   All other systems reviewed and are negative.   Physical Exam Triage Vital Signs ED Triage Vitals [10/24/21 1112]  Enc Vitals Group     BP 122/78     Pulse Rate 69     Resp 18     Temp 97.9 F (36.6 C)     Temp Source Oral     SpO2 97 %     Weight      Height      Head Circumference      Peak Flow      Pain Score 0     Pain Loc      Pain Edu?      Excl. in [place]?    No data found.  Updated Vital Signs BP 122/78 (BP Location: Right Arm)   Pulse 69   Temp 97.9 F (36.6 C) (Oral)   Resp  18   LMP  (LMP Unknown)   SpO2 97%   Visual Acuity Right Eye Distance:   Left Eye Distance:   Bilateral Distance:    Right Eye Near:   Left Eye Near:    Bilateral Near:     Physical Exam Vitals and nursing note reviewed.  Constitutional:      General: She is not in acute distress.    Appearance: She is well-developed.  HENT:     Head: Normocephalic and atraumatic.     Right Ear: Tympanic membrane normal.     Left Ear: Tympanic membrane normal.     Mouth/Throat:     Mouth: Mucous membranes are moist.  Eyes:     Conjunctiva/sclera: Conjunctivae normal.  Cardiovascular:     Rate and Rhythm: Normal rate and regular rhythm.     Heart sounds: No murmur heard. Pulmonary:     Effort: Pulmonary effort is normal. No respiratory distress.     Breath sounds: Normal breath sounds.  Abdominal:     Palpations: Abdomen is soft.     Tenderness: There is no abdominal tenderness.  Musculoskeletal:     Cervical back: Neck supple.  Skin:    General: Skin is warm and dry.  Neurological:     Mental Status: She is alert.  Psychiatric:        Mood and Affect: Mood normal.     UC Treatments / Results  Labs (all labs ordered are listed, but only abnormal results are displayed) Labs Reviewed  COVID-19, FLU A+B NAA    EKG   Radiology No results found.  Procedures Procedures (including critical care time)  Medications Ordered in UC Medications - No data to display  Initial Impression / Assessment and Plan / UC Course  I have reviewed the triage vital signs and the nursing notes.  Pertinent labs & imaging results that were available during my care of the patient were reviewed by me and considered in my medical decision making (see chart for details).     MDM:  Covid and influenza pending.  Pt  Final Clinical Impressions(s) / UC Diagnoses   Final diagnoses:  Viral URI     Discharge Instructions      Your test are pending    ED Prescriptions   None  PDMP not  reviewed this encounter. An After Visit Summary was printed and given to the patient.    Fransico Meadow, [place] 10/24/21 1255

## 2021-10-24 NOTE — Discharge Instructions (Addendum)
Your test are pending 

## 2021-10-24 NOTE — ED Triage Notes (Addendum)
Patient c/o nasal congestion x 4 days.  Patient denies fever at home.   Patient endorses chills upon onset of symptoms.   Patient endorses nonproductive cough.   Patient endorses chest congestion. Patient endorses SOB at night.   Patient is currently 7 months pregnant.   Patient has taken Claritin and Zyrtec every day with no relief of symptoms.

## 2021-10-25 LAB — COVID-19, FLU A+B NAA
Influenza A, NAA: NOT DETECTED
Influenza B, NAA: NOT DETECTED
SARS-CoV-2, NAA: NOT DETECTED

## 2021-10-29 DIAGNOSIS — Z419 Encounter for procedure for purposes other than remedying health state, unspecified: Secondary | ICD-10-CM | POA: Diagnosis not present

## 2021-10-30 ENCOUNTER — Ambulatory Visit (INDEPENDENT_AMBULATORY_CARE_PROVIDER_SITE_OTHER): Payer: Medicaid Other | Admitting: Advanced Practice Midwife

## 2021-10-30 ENCOUNTER — Other Ambulatory Visit: Payer: Self-pay

## 2021-10-30 VITALS — BP 130/79 | HR 79 | Wt 172.0 lb

## 2021-10-30 DIAGNOSIS — F1290 Cannabis use, unspecified, uncomplicated: Secondary | ICD-10-CM | POA: Insufficient documentation

## 2021-10-30 DIAGNOSIS — Z3A31 31 weeks gestation of pregnancy: Secondary | ICD-10-CM

## 2021-10-30 DIAGNOSIS — O09893 Supervision of other high risk pregnancies, third trimester: Secondary | ICD-10-CM

## 2021-10-30 DIAGNOSIS — O24410 Gestational diabetes mellitus in pregnancy, diet controlled: Secondary | ICD-10-CM

## 2021-10-30 LAB — POCT URINALYSIS DIPSTICK OB
Blood, UA: NEGATIVE
Glucose, UA: NEGATIVE
Ketones, UA: NEGATIVE
Leukocytes, UA: NEGATIVE
Nitrite, UA: NEGATIVE
POC,PROTEIN,UA: NEGATIVE

## 2021-10-30 NOTE — Patient Instructions (Signed)
Emily [name], thank you for choosing our office today! We appreciate the opportunity to meet your healthcare needs. You may receive a short survey by mail, e-mail, or through MyChart. If you are happy with your care we would appreciate if you could take just a few minutes to complete the survey questions. We read all of your comments and take your feedback very seriously. Thank you again for choosing our office.  [facility] ([address] [place], [place] [address]) Entrance C, located off of [address] Free 24/7 valet parking   CLASSES: Go to [email or web address] to register for classes (childbirth, breastfeeding, waterbirth, infant CPR, daddy bootcamp, etc.)  Call the office ([phone]) or go to [facility] if: You begin to have strong, frequent contractions Your water breaks.  Sometimes it is a big gush of fluid, sometimes it is just a trickle that keeps getting your panties wet or running down your legs You have vaginal bleeding.  It is normal to have a small amount of spotting if your cervix was checked.  You don't feel your baby moving like normal.  If you don't, get you something to eat and drink and lay down and focus on feeling your baby move.   If your baby is still not moving like normal, you should call the office or go to [facility].  Call the office ([phone]) or go to [facility] for these signs of pre-eclampsia: Severe headache that does not go away with Tylenol Visual changes- seeing spots, double, blurred vision Pain under your right breast or upper abdomen that does not go away with Tums or heartburn medicine Nausea and/or vomiting Severe swelling in your hands, feet, and face   Tdap Vaccine It is recommended that you get the Tdap vaccine during the third trimester of EACH pregnancy to help protect your baby from getting pertussis (whooping cough) 27-36 weeks is the BEST time to do  this so that you can pass the protection on to your baby. During pregnancy is better than after pregnancy, but if you are unable to get it during pregnancy it will be offered at the hospital.  You can get this vaccine with us, at the health department, your family doctor, or some local pharmacies Everyone who will be around your baby should also be up-to-date on their vaccines before the baby comes. Adults (who are not pregnant) only need 1 dose of Tdap during adulthood.   [place] Pediatricians/Family Doctors [facility] ([facility]): [address], [phone]           [facility]: [address], [phone]                [facility] ([facility]): [address], [phone] (call to ask if accepting patients) [facility]: [address], [place], [phone]    [place] Pediatricians/Family Doctors [facility] ([facility]): [address], [phone] [facility]: [address], [phone] [facility]: [address], [phone]  [place] [facility]  [facility] ([facility]): [phone] [facility]: [address], [phone]   [place] Family Doctors [facility]: [address], [phone]  [place] [facility]  [facility]: [address], [phone]  Home Blood Pressure Monitoring for Patients   Your provider has recommended that you check your   blood pressure (BP) at least once a week at home. If you do not have a blood pressure cuff at home, one will be provided for you. Contact your provider if you have not received your monitor within 1 week.   Helpful Tips for Accurate Home Blood Pressure Checks  Don't smoke, exercise, or drink caffeine 30 minutes before checking your BP Use the restroom before checking your BP (a full bladder can raise your  pressure) Relax in a comfortable upright chair Feet on the ground Left arm resting comfortably on a flat surface at the level of your heart Legs uncrossed Back supported Sit quietly and don't talk Place the cuff on your bare arm Adjust snuggly, so that only two fingertips can fit between your skin and the top of the cuff Check 2 readings separated by at least one minute Keep a log of your BP readings For a visual, please reference this diagram: http://ccnc.care/bpdiagram  Provider Name: [facility]     Phone: [phone]  Zone 1: ALL CLEAR  Continue to monitor your symptoms:  BP reading is less than 140 (top number) or less than 90 (bottom number)  No right upper stomach pain No headaches or seeing spots No feeling nauseated or throwing up No swelling in face and hands  Zone 2: CAUTION Call your doctor's office for any of the following:  BP reading is greater than 140 (top number) or greater than 90 (bottom number)  Stomach pain under your ribs in the middle or right side Headaches or seeing spots Feeling nauseated or throwing up Swelling in face and hands  Zone 3: EMERGENCY  Seek immediate medical care if you have any of the following:  BP reading is greater than160 (top number) or greater than 110 (bottom number) Severe headaches not improving with Tylenol Serious difficulty catching your breath Any worsening symptoms from Zone 2   Third Trimester of Pregnancy The third trimester is from week 29 through week 42, months 7 through 9. The third trimester is a time when the fetus is growing rapidly. At the end of the ninth month, the fetus is about 20 inches in length and weighs 6-10 pounds.  BODY CHANGES Your body goes through many changes during pregnancy. The changes vary from woman to woman.  Your weight will continue to increase. You can expect to gain 25-35 pounds (11-16 kg) by the end of the pregnancy. You may begin to get stretch marks on your hips, abdomen,  and breasts. You may urinate more often because the fetus is moving lower into your pelvis and pressing on your bladder. You may develop or continue to have heartburn as a result of your pregnancy. You may develop constipation because certain hormones are causing the muscles that push waste through your intestines to slow down. You may develop hemorrhoids or swollen, bulging veins (varicose veins). You may have pelvic pain because of the weight gain and pregnancy hormones relaxing your joints between the bones in your pelvis. Backaches may result from overexertion of the muscles supporting your posture. You may have changes in your hair. These can include thickening of your hair, rapid growth, and changes in texture. Some women also have hair loss during or after pregnancy, or hair that feels dry or thin. Your hair will most likely return to normal after your baby is born. Your breasts will continue to grow and be tender. A yellow discharge may leak from your breasts called colostrum. Your belly button may stick out. You may   feel short of breath because of your expanding uterus. You may notice the fetus "dropping," or moving lower in your abdomen. You may have a bloody mucus discharge. This usually occurs a few days to a week before labor begins. Your cervix becomes thin and soft (effaced) near your due date. WHAT TO EXPECT AT YOUR PRENATAL EXAMS  You will have prenatal exams every 2 weeks until week 36. Then, you will have weekly prenatal exams. During a routine prenatal visit: You will be weighed to make sure you and the fetus are growing normally. Your blood pressure is taken. Your abdomen will be measured to track your baby's growth. The fetal heartbeat will be listened to. Any test results from the previous visit will be discussed. You may have a cervical check near your due date to see if you have effaced. At around 36 weeks, your caregiver will check your cervix. At the same time, your  caregiver will also perform a test on the secretions of the vaginal tissue. This test is to determine if a type of bacteria, Group B streptococcus, is present. Your caregiver will explain this further. Your caregiver may ask you: What your birth plan is. How you are feeling. If you are feeling the baby move. If you have had any abnormal symptoms, such as leaking fluid, bleeding, severe headaches, or abdominal cramping. If you have any questions. Other tests or screenings that may be performed during your third trimester include: Blood tests that check for low iron levels (anemia). Fetal testing to check the health, activity level, and growth of the fetus. Testing is done if you have certain medical conditions or if there are problems during the pregnancy. FALSE LABOR You may feel small, irregular contractions that eventually go away. These are called Braxton Hicks contractions, or false labor. Contractions may last for hours, days, or even weeks before true labor sets in. If contractions come at regular intervals, intensify, or become painful, it is best to be seen by your caregiver.  SIGNS OF LABOR  Menstrual-like cramps. Contractions that are 5 minutes apart or less. Contractions that start on the top of the uterus and spread down to the lower abdomen and back. A sense of increased pelvic pressure or back pain. A watery or bloody mucus discharge that comes from the vagina. If you have any of these signs before the 37th week of pregnancy, call your caregiver right away. You need to go to the hospital to get checked immediately. HOME CARE INSTRUCTIONS  Avoid all smoking, herbs, alcohol, and unprescribed drugs. These chemicals affect the formation and growth of the baby. Follow your caregiver's instructions regarding medicine use. There are medicines that are either safe or unsafe to take during pregnancy. Exercise only as directed by your caregiver. Experiencing uterine cramps is a good sign to  stop exercising. Continue to eat regular, healthy meals. Wear a good support bra for breast tenderness. Do not use hot tubs, steam rooms, or saunas. Wear your seat belt at all times when driving. Avoid raw meat, uncooked cheese, cat litter boxes, and soil used by cats. These carry germs that can cause birth defects in the baby. Take your prenatal vitamins. Try taking a stool softener (if your caregiver approves) if you develop constipation. Eat more high-fiber foods, such as fresh vegetables or fruit and whole grains. Drink plenty of fluids to keep your urine clear or pale yellow. Take warm sitz baths to soothe any pain or discomfort caused by hemorrhoids. Use hemorrhoid cream if   your caregiver approves. If you develop varicose veins, wear support hose. Elevate your feet for 15 minutes, 3-4 times a day. Limit salt in your diet. Avoid heavy lifting, wear low heal shoes, and practice good posture. Rest a lot with your legs elevated if you have leg cramps or low back pain. Visit your dentist if you have not gone during your pregnancy. Use a soft toothbrush to brush your teeth and be gentle when you floss. A sexual relationship may be continued unless your caregiver directs you otherwise. Do not travel far distances unless it is absolutely necessary and only with the approval of your caregiver. Take prenatal classes to understand, practice, and ask questions about the labor and delivery. Make a trial run to the hospital. Pack your hospital bag. Prepare the baby's nursery. Continue to go to all your prenatal visits as directed by your caregiver. SEEK MEDICAL CARE IF: You are unsure if you are in labor or if your water has broken. You have dizziness. You have mild pelvic cramps, pelvic pressure, or nagging pain in your abdominal area. You have persistent nausea, vomiting, or diarrhea. You have a bad smelling vaginal discharge. You have pain with urination. SEEK IMMEDIATE MEDICAL CARE IF:  You  have a fever. You are leaking fluid from your vagina. You have spotting or bleeding from your vagina. You have severe abdominal cramping or pain. You have rapid weight loss or gain. You have shortness of breath with chest pain. You notice sudden or extreme swelling of your face, hands, ankles, feet, or legs. You have not felt your baby move in over an hour. You have severe headaches that do not go away with medicine. You have vision changes. Document Released: 12/09/2001 Document Revised: 12/20/2013 Document Reviewed: 02/15/2013 ExitCare Patient Information 2015 [employer]. This information is not intended to replace advice given to you by your health care provider. Make sure you discuss any questions you have with your health care provider.       

## 2021-10-30 NOTE — Progress Notes (Addendum)
[place] PREGNANCY VISIT Patient name: Emily [name] MRN [id number]  Date of birth: 1991-01-22 Chief Complaint:   Routine Prenatal Visit  History of Present Illness:   Emily [name] is a 30 y.o. G89P1021 female at [redacted]w[redacted]d with an Estimated Date of Delivery: 12/29/21 being seen today for ongoing management of a high-risk pregnancy complicated by diabetes mellitus A1DM.  Brought log: FBS all <90; had one PP that was 123, all the rest <120. Has to reschedule diabetic ed because she was sick last week and had to miss. Reports that she hasn't been smoking cigarettes or THC since first trimester.  Today she reports carpal tunnel symptoms. Contractions: Not present. Vag. Bleeding: None.  Movement: Present. denies leaking of fluid.   Depression screen [facility] 2/9 10/01/2021 06/27/2021 04/25/2021  Decreased Interest 1 1 1   Down, Depressed, Hopeless 0 0 1  PHQ - 2 Score 1 1 2   Altered sleeping 1 1 1   Tired, decreased energy 1 0 1  Change in appetite 1 2 0  Feeling bad or failure about yourself  0 0 0  Trouble concentrating 0 0 0  Moving slowly or fidgety/restless 0 0 0  Suicidal thoughts 0 0 0  PHQ-9 Score 4 4 4      GAD 7 : Generalized Anxiety Score 10/01/2021 06/27/2021 04/25/2021  Nervous, Anxious, on Edge 0 1 1  Control/stop worrying 1 1 1   Worry too much - different things 1 1 0  Trouble relaxing 1 1 0  Restless 0 0 0  Easily annoyed or irritable 0 0 1  Afraid - awful might happen 1 1 1   Total GAD 7 Score 4 5 4      Review of Systems:   Pertinent items are noted in HPI Denies abnormal vaginal discharge w/ itching/odor/irritation, headaches, visual changes, shortness of breath, chest pain, abdominal pain, severe nausea/vomiting, or problems with urination or bowel movements unless otherwise stated above. Pertinent History Reviewed:  Reviewed past medical,surgical, social, obstetrical and family history.  Reviewed problem list, medications and allergies. Physical Assessment:   Vitals:    10/30/21 0916  BP: 130/79  Pulse: 79  Weight: 172 lb (78 kg)  Body mass index is 31.46 kg/m.           Physical Examination:   General appearance: alert, well appearing, and in no distress  Mental status: alert, oriented to person, place, and time  Skin: warm & dry   Extremities: Edema: Trace    Cardiovascular: normal heart rate noted  Respiratory: normal respiratory effort, no distress  Abdomen: gravid, soft, non-tender  Pelvic: Cervical exam deferred         Fetal Status: Fetal Heart Rate (bpm): 143 Fundal Height: 31 cm Movement: Present    Fetal Surveillance Testing today: doppler    Results for orders placed or performed in visit on 10/30/21 (from the past 24 hour(s))  POC Urinalysis Dipstick OB   Collection Time: 10/30/21  9:20 AM  Result Value Ref Range   Color, UA     Clarity, UA     Glucose, UA Negative Negative   Bilirubin, UA     Ketones, UA neg    Spec Grav, UA     Blood, UA neg    pH, UA     POC,PROTEIN,UA Negative Negative, Trace, Small (1+), Moderate (2+), Large (3+), 4+   Urobilinogen, UA     Nitrite, UA neg    Leukocytes, UA Negative Negative   Appearance     Odor  Assessment & Plan:  High-risk pregnancy: [id number] at [redacted]w[redacted]d with an Estimated Date of Delivery: 12/29/21   1) GDMA1, stable without meds; will get growth scan with next visit; IOL 39-40wks  2) Carpal tunnel symptoms bilat, rec wrist splints to try at night to start and can wear 24h if needed; elevate arms   Meds: No orders of the defined types were placed in this encounter.   Labs/procedures today: none  Treatment Plan:  growth with next visit and at 36wks; IOL 39-40wks as long as remains off meds  Reviewed: Preterm labor symptoms and general obstetric precautions including but not limited to vaginal bleeding, contractions, leaking of fluid and fetal movement were reviewed in detail with the patient.  All questions were answered. Does have home bp cuff. Office bp cuff given: not  applicable. Check bp weekly, let us know if consistently >140 and/or >90.  Follow-up: Return in about 2 weeks (around 11/13/2021) for HROB, [place]: EFW.   No future appointments.  Orders Placed This Encounter  Procedures   US OB Follow Up   POC Urinalysis Dipstick OB   Arabella Merles [facility] 10/30/2021 10:18 AM

## 2021-11-07 ENCOUNTER — Other Ambulatory Visit: Payer: Self-pay

## 2021-11-07 ENCOUNTER — Ambulatory Visit: Payer: Medicaid Other | Admitting: Registered"

## 2021-11-07 ENCOUNTER — Encounter: Payer: Medicaid Other | Attending: Women's Health | Admitting: Registered"

## 2021-11-07 DIAGNOSIS — Z3A00 Weeks of gestation of pregnancy not specified: Secondary | ICD-10-CM | POA: Insufficient documentation

## 2021-11-07 DIAGNOSIS — O24410 Gestational diabetes mellitus in pregnancy, diet controlled: Secondary | ICD-10-CM

## 2021-11-07 DIAGNOSIS — O24419 Gestational diabetes mellitus in pregnancy, unspecified control: Secondary | ICD-10-CM | POA: Diagnosis not present

## 2021-11-07 NOTE — Progress Notes (Signed)
Patient was seen for Gestational Diabetes self-management on 11/07/21  Start time 1610 and End time 1010   Estimated due date: 12/29/21; [redacted]w[redacted]d  Clinical: Medications: aspirin, prenatal Medical History: reviewed Labs: OGTT [id number], A1c n/a%   Dietary and Lifestyle History: Pt states she does not have any prior education or experience with diabetes. Pt reports that she continues to have nausea.   Pt states she quit working 2 weeks ago, had been on her feel for 10 hrs/day. Pt states she continues to be active but uncomfortable with feet swelling.   Pt states she has been having sweetened cereal but since diagnosis bought regular cheerios. Pt states although she drinks ~12 cans of Mtn Dew/day it is less than she used to drink. Pt states she stopped making sweet at home and now will only drink on occasion when she goes out to eat.  Physical Activity: ADLs Stress: not assessed Sleep: 8 hrs  24 hr Recall:  First Meal:none or cereal Snack: Second meal: ham sandwich OR oodles of noodles Snack: grapes Third meal: meatloaf, mashed potatoes, peas Snack: Beverages: 12 pk Mt Dew, ~6 bottles of water  NUTRITION INTERVENTION  Nutrition education (E-1) on the following topics:   Initial Follow-up  [x]  []  Definition of Gestational Diabetes [x]  []  Why dietary management is important in controlling blood glucose [x]  []  Effects each nutrient has on blood glucose levels [x]  []  Simple carbohydrates vs complex carbohydrates [x]  []  Fluid intake [x]  []  Creating a balanced meal plan [x]  []  Carbohydrate counting  [x]  []  When to check blood glucose levels [x]  []  Proper blood glucose monitoring techniques [x]  []  Effect of stress and stress reduction techniques  [x]  []  Exercise effect on blood glucose levels, appropriate exercise during pregnancy [x]  []  Importance of limiting caffeine and abstaining from alcohol and smoking [x]  []  Medications used for blood sugar control during  pregnancy [x]  []  Hypoglycemia and rule of 15 [x]  []  Postpartum self care  Patient already has a meter, is testing pre breakfast and 2 hours after each meal. FBS: WNL Postprandial: WNL  Patient instructed to monitor glucose levels: FBS: 60 - ? 95 mg/dL (some clinics use 90 for cutoff) 1 hour: ? 140 mg/dL 2 hour: ? mg/dL  Patient received handouts: Nutrition Diabetes and Pregnancy Carbohydrate Counting List  Patient will be seen for follow-up as needed.

## 2021-11-11 ENCOUNTER — Ambulatory Visit (INDEPENDENT_AMBULATORY_CARE_PROVIDER_SITE_OTHER): Payer: Medicaid Other | Admitting: Women's Health

## 2021-11-11 ENCOUNTER — Ambulatory Visit (INDEPENDENT_AMBULATORY_CARE_PROVIDER_SITE_OTHER): Payer: Medicaid Other

## 2021-11-11 ENCOUNTER — Encounter: Payer: Self-pay | Admitting: Women's Health

## 2021-11-11 ENCOUNTER — Other Ambulatory Visit: Payer: Self-pay

## 2021-11-11 VITALS — BP 131/80 | HR 84 | Wt 175.5 lb

## 2021-11-11 DIAGNOSIS — O0990 Supervision of high risk pregnancy, unspecified, unspecified trimester: Secondary | ICD-10-CM

## 2021-11-11 DIAGNOSIS — O24410 Gestational diabetes mellitus in pregnancy, diet controlled: Secondary | ICD-10-CM

## 2021-11-11 DIAGNOSIS — O0993 Supervision of high risk pregnancy, unspecified, third trimester: Secondary | ICD-10-CM

## 2021-11-11 DIAGNOSIS — Z3A33 33 weeks gestation of pregnancy: Secondary | ICD-10-CM

## 2021-11-11 NOTE — Progress Notes (Signed)
HIGH-RISK PREGNANCY VISIT Patient name: Emily [name] MRN [id number]  Date of birth: 05/07/1991 Chief Complaint:   High Risk Gestation ([place] today)  History of Present Illness:   Emily [name] is a 30 y.o. G64P1021 female at [redacted]w[redacted]d with an Estimated Date of Delivery: 12/29/21 being seen today for ongoing management of a high-risk pregnancy complicated by diabetes mellitus A1DM.    Today she reports  didn't bring log, reports all sugars wnl . Contractions: Not present. Vag. Bleeding: None.  Movement: Present. denies leaking of fluid.   Depression screen [facility] 2/9 10/01/2021 06/27/2021 04/25/2021  Decreased Interest 1 1 1   Down, Depressed, Hopeless 0 0 1  PHQ - 2 Score 1 1 2   Altered sleeping 1 1 1   Tired, decreased energy 1 0 1  Change in appetite 1 2 0  Feeling bad or failure about yourself  0 0 0  Trouble concentrating 0 0 0  Moving slowly or fidgety/restless 0 0 0  Suicidal thoughts 0 0 0  PHQ-9 Score 4 4 4      GAD 7 : Generalized Anxiety Score 10/01/2021 06/27/2021 04/25/2021  Nervous, Anxious, on Edge 0 1 1  Control/stop worrying 1 1 1   Worry too much - different things 1 1 0  Trouble relaxing 1 1 0  Restless 0 0 0  Easily annoyed or irritable 0 0 1  Afraid - awful might happen 1 1 1   Total GAD 7 Score 4 5 4      Review of Systems:   Pertinent items are noted in HPI Denies abnormal vaginal discharge w/ itching/odor/irritation, headaches, visual changes, shortness of breath, chest pain, abdominal pain, severe nausea/vomiting, or problems with urination or bowel movements unless otherwise stated above. Pertinent History Reviewed:  Reviewed past medical,surgical, social, obstetrical and family history.  Reviewed problem list, medications and allergies. Physical Assessment:   Vitals:   11/11/21 1557  BP: 131/80  Pulse: 84  Weight: 175 lb 8 oz (79.6 kg)  Body mass index is 32.1 kg/m.           Physical Examination:   General appearance: alert, well appearing, and in no  distress  Mental status: alert, oriented to person, place, and time  Skin: warm & dry   Extremities: Edema: Trace    Cardiovascular: normal heart rate noted  Respiratory: normal respiratory effort, no distress  Abdomen: gravid, soft, non-tender  Pelvic: Cervical exam deferred         Fetal Status: Fetal Heart Rate (bpm): 142 u/s   Movement: Present    Fetal Surveillance Testing today: [place] [id number] wks,cephalic,anterior placenta gr 3,afi 11.3 cm,FHR 142 bpm,normal ovaries,EFW 2294 g 64%  Chaperone: N/A    No results found for this or any previous visit (from the past 24 hour(s)).  Assessment & Plan:  High-risk pregnancy: [id number] at [redacted]w[redacted]d with an Estimated Date of Delivery: 12/29/21   1) A1DM, stable, send pic of logs via mychart. EFW 64% today   Meds: No orders of the defined types were placed in this encounter.   Labs/procedures today: U/S  Treatment Plan:   Growth u/s q4wks    No antenatal testing as long as remains off meds     Deliver @ 39-40wks:____   Reviewed: Preterm labor symptoms and general obstetric precautions including but not limited to vaginal bleeding, contractions, leaking of fluid and fetal movement were reviewed in detail with the patient.  All questions were answered. Does have home bp cuff. Office bp cuff given: not applicable.  Check bp weekly, let us know if consistently >140 and/or >90.  Follow-up: Return in about 2 weeks (around 11/25/2021) for HROB, MD or CNM, in person.   No future appointments.  No orders of the defined types were placed in this encounter.  Cheral Marker CNM, [facility] 11/11/2021 4:23 PM

## 2021-11-11 NOTE — Patient Instructions (Signed)
Emily [name], thank you for choosing our office today! We appreciate the opportunity to meet your healthcare needs. You may receive a short survey by mail, e-mail, or through MyChart. If you are happy with your care we would appreciate if you could take just a few minutes to complete the survey questions. We read all of your comments and take your feedback very seriously. Thank you again for choosing our office.  [facility] ([address] [place], [place] [address]) Entrance C, located off of [address] Free 24/7 valet parking   CLASSES: Go to [email or web address] to register for classes (childbirth, breastfeeding, waterbirth, infant CPR, daddy bootcamp, etc.)  Call the office ([phone]) or go to [facility] if: You begin to have strong, frequent contractions Your water breaks.  Sometimes it is a big gush of fluid, sometimes it is just a trickle that keeps getting your panties wet or running down your legs You have vaginal bleeding.  It is normal to have a small amount of spotting if your cervix was checked.  You don't feel your baby moving like normal.  If you don't, get you something to eat and drink and lay down and focus on feeling your baby move.   If your baby is still not moving like normal, you should call the office or go to [facility].  Call the office ([phone]) or go to [facility] for these signs of pre-eclampsia: Severe headache that does not go away with Tylenol Visual changes- seeing spots, double, blurred vision Pain under your right breast or upper abdomen that does not go away with Tums or heartburn medicine Nausea and/or vomiting Severe swelling in your hands, feet, and face   Tdap Vaccine It is recommended that you get the Tdap vaccine during the third trimester of EACH pregnancy to help protect your baby from getting pertussis (whooping cough) 27-36 weeks is the BEST time to do  this so that you can pass the protection on to your baby. During pregnancy is better than after pregnancy, but if you are unable to get it during pregnancy it will be offered at the hospital.  You can get this vaccine with us, at the health department, your family doctor, or some local pharmacies Everyone who will be around your baby should also be up-to-date on their vaccines before the baby comes. Adults (who are not pregnant) only need 1 dose of Tdap during adulthood.   [place] Pediatricians/Family Doctors [facility] ([facility]): [address], [phone]           [facility]: [address], [phone]                [facility] ([facility]): [address], [phone] (call to ask if accepting patients) [facility]: [address], [place], [phone]    [place] Pediatricians/Family Doctors [facility] ([facility]): [address], [phone] [facility]: [address], [phone] [facility]: [address], [phone]  [place] [facility]  [facility] ([facility]): [phone] [facility]: [address], [phone]   [place] Family Doctors [facility]: [address], [phone]  [place] [facility]  [facility]: [phone], [phone]  Home Blood Pressure Monitoring for Patients   Your provider has recommended that you check your   blood pressure (BP) at least once a week at home. If you do not have a blood pressure cuff at home, one will be provided for you. Contact your provider if you have not received your monitor within 1 week.   Helpful Tips for Accurate Home Blood Pressure Checks  Don't smoke, exercise, or drink caffeine 30 minutes before checking your BP Use the restroom before checking your BP (a full bladder can raise your  pressure) Relax in a comfortable upright chair Feet on the ground Left arm resting comfortably on a flat surface at the level of your heart Legs uncrossed Back supported Sit quietly and don't talk Place the cuff on your bare arm Adjust snuggly, so that only two fingertips can fit between your skin and the top of the cuff Check 2 readings separated by at least one minute Keep a log of your BP readings For a visual, please reference this diagram: http://ccnc.care/bpdiagram  Provider Name: [facility]     Phone: [phone]  Zone 1: ALL CLEAR  Continue to monitor your symptoms:  BP reading is less than 140 (top number) or less than 90 (bottom number)  No right upper stomach pain No headaches or seeing spots No feeling nauseated or throwing up No swelling in face and hands  Zone 2: CAUTION Call your doctor's office for any of the following:  BP reading is greater than 140 (top number) or greater than 90 (bottom number)  Stomach pain under your ribs in the middle or right side Headaches or seeing spots Feeling nauseated or throwing up Swelling in face and hands  Zone 3: EMERGENCY  Seek immediate medical care if you have any of the following:  BP reading is greater than160 (top number) or greater than 110 (bottom number) Severe headaches not improving with Tylenol Serious difficulty catching your breath Any worsening symptoms from Zone 2  Preterm Labor and Birth Information  The normal length of a pregnancy is 39-41 weeks. Preterm labor is when labor starts before 37 completed weeks of pregnancy. What are the risk factors for preterm labor? Preterm labor is more likely to occur in women who: Have certain infections during pregnancy such as a bladder infection, sexually transmitted infection, or infection inside the uterus (chorioamnionitis). Have a shorter-than-normal cervix. Have gone into preterm labor before. Have had surgery on their cervix. Are younger than age 17  or older than age 35. Are African American. Are pregnant with twins or multiple babies (multiple gestation). Take street drugs or smoke while pregnant. Do not gain enough weight while pregnant. Became pregnant shortly after having been pregnant. What are the symptoms of preterm labor? Symptoms of preterm labor include: Cramps similar to those that can happen during a menstrual period. The cramps may happen with diarrhea. Pain in the abdomen or lower back. Regular uterine contractions that may feel like tightening of the abdomen. A feeling of increased pressure in the pelvis. Increased watery or bloody mucus discharge from the vagina. Water breaking (ruptured amniotic sac). Why is it important to recognize signs of preterm labor? It is important to recognize signs of preterm labor because babies who are born prematurely may not be fully developed. This can put them at an increased risk for: Long-term (chronic) heart and lung problems. Difficulty immediately after birth with regulating body systems, including blood sugar, body temperature, heart rate, and breathing rate. Bleeding in the brain. Cerebral palsy. Learning difficulties. Death. These risks are highest for babies who are born before 34 weeks   of pregnancy. How is preterm labor treated? Treatment depends on the length of your pregnancy, your condition, and the health of your baby. It may involve: Having a stitch (suture) placed in your cervix to prevent your cervix from opening too early (cerclage). Taking or being given medicines, such as: Hormone medicines. These may be given early in pregnancy to help support the pregnancy. Medicine to stop contractions. Medicines to help mature the baby's lungs. These may be prescribed if the risk of delivery is high. Medicines to prevent your baby from developing cerebral palsy. If the labor happens before 34 weeks of pregnancy, you may need to stay in the hospital. What should I do if I  think I am in preterm labor? If you think that you are going into preterm labor, call your health care provider right away. How can I prevent preterm labor in future pregnancies? To increase your chance of having a full-term pregnancy: Do not use any tobacco products, such as cigarettes, chewing tobacco, and e-cigarettes. If you need help quitting, ask your health care provider. Do not use street drugs or medicines that have not been prescribed to you during your pregnancy. Talk with your health care provider before taking any herbal supplements, even if you have been taking them regularly. Make sure you gain a healthy amount of weight during your pregnancy. Watch for infection. If you think that you might have an infection, get it checked right away. Make sure to tell your health care provider if you have gone into preterm labor before. This information is not intended to replace advice given to you by your health care provider. Make sure you discuss any questions you have with your health care provider. Document Revised: 04/08/2019 Document Reviewed: 05/07/2016 Elsevier Patient Education  2020 Elsevier Inc.   

## 2021-11-14 ENCOUNTER — Encounter: Payer: Self-pay | Admitting: Women's Health

## 2021-11-26 ENCOUNTER — Encounter: Payer: Self-pay | Admitting: Obstetrics & Gynecology

## 2021-11-26 ENCOUNTER — Other Ambulatory Visit: Payer: Self-pay

## 2021-11-26 ENCOUNTER — Ambulatory Visit (INDEPENDENT_AMBULATORY_CARE_PROVIDER_SITE_OTHER): Payer: Medicaid Other | Admitting: Obstetrics & Gynecology

## 2021-11-26 VITALS — BP 131/73 | HR 95 | Wt 178.8 lb

## 2021-11-26 DIAGNOSIS — O24410 Gestational diabetes mellitus in pregnancy, diet controlled: Secondary | ICD-10-CM

## 2021-11-26 NOTE — Progress Notes (Signed)
[place] PREGNANCY VISIT Patient name: Emily [name] MRN [id number]  Date of birth: [birth year] Chief Complaint:   Routine Prenatal Visit  History of Present Illness:   Emily [name] is a 30 y.o. G14P1021 female at 87w2dwith an Estimated Date of Delivery: 12/29/21 being seen today for ongoing management of a high-risk pregnancy complicated by:  GDMA1- sugars reviewed well controlled with diet.    Today she reports occasional contractions.   Contractions: Not present. Vag. Bleeding: None.  Movement: Present. denies leaking of fluid.   [facility] 10/01/2021 06/27/2021 04/25/2021  Decreased Interest '1 1 1  ' Down, Depressed, Hopeless 0 0 1  PHQ - 2 Score '1 1 2  ' Altered sleeping '1 1 1  ' Tired, decreased energy 1 0 1  Change in appetite 1 2 0  Feeling bad or failure about yourself  0 0 0  Trouble concentrating 0 0 0  Moving slowly or fidgety/restless 0 0 0  Suicidal thoughts 0 0 0  PHQ-9 Score '4 4 4     ' Current Outpatient Medications  Medication Instructions   Accu-Chek Softclix Lancets lancets Check blood sugar four times a day.   acetaminophen (TYLENOL) 1,000 mg, Oral, Every 6 hours PRN   aspirin 81 mg, Oral, Daily, Swallow whole.   Blood Glucose Monitoring Suppl (ACCU-CHEK GUIDE) w/Device KIT 1 kit, Does not apply, 4 times daily   Blood Pressure Monitor MISC For regular home bp monitoring during pregnancy   Doxylamine-Pyridoxine (DICLEGIS) 10-10 MG TBEC Take 2 qhs; may also take one in am and one in afternoon prn nausea   glucose blood (ACCU-CHEK GUIDE) test strip Check blood sugar four times daily.   ondansetron (ZOFRAN ODT) 4 mg, Oral, Every 6 hours PRN   Prenatal Vit-Fe Fumarate-FA (PRENATAL VITAMIN PO) Oral   promethazine (PHENERGAN) 25 mg, Oral, Every 6 hours PRN     Review of Systems:   Pertinent items are noted in HPI Denies abnormal vaginal discharge w/ itching/odor/irritation, headaches, visual changes, shortness of breath, chest pain, abdominal pain,  severe nausea/vomiting, or problems with urination or bowel movements unless otherwise stated above. Pertinent History Reviewed:  Reviewed past medical,surgical, social, obstetrical and family history.  Reviewed problem list, medications and allergies. Physical Assessment:   Vitals:   11/26/21 1334  BP: 131/73  Pulse: 95  Weight: 178 lb 12.8 oz (81.1 kg)  Body mass index is 32.7 kg/m.           Physical Examination:   General appearance: alert, well appearing, and in no distress  Mental status: normal mood, behavior, speech, dress, motor activity, and thought processes  Skin: warm & dry   Extremities: Edema: Trace    Cardiovascular: normal heart rate noted  Respiratory: normal respiratory effort, no distress  Abdomen: gravid, soft, non-tender  Pelvic: Cervical exam deferred         Fetal Status: Fetal Heart Rate (bpm): 145 Fundal Height: 35 cm Movement: Present    Fetal Surveillance Testing today: doppler   Chaperone: N/A    No results found for this or any previous visit (from the past 24 hour(s)).   Assessment & Plan:  High-risk pregnancy: [id number] 370w2dith an Estimated Date of Delivery: 12/29/21   1) GDMA1- well controlled -continue growth q 4wks  -GBS next visit -weekly visits -discussed IOL @ 39wks  Meds: No orders of the defined types were placed in this encounter.   Labs/procedures today: none  Treatment Plan:  as outlined above  Reviewed:  Preterm labor symptoms and general obstetric precautions including but not limited to vaginal bleeding, contractions, leaking of fluid and fetal movement were reviewed in detail with the patient.  All questions were answered.   Follow-up: Return in about 1 week (around 12/03/2021) for [place] visit, needs growth scan (around 12/14).   Future Appointments  Date Time Provider [place]  12/03/2021  3:30 PM Roma Schanz, CNM [facility]    No orders of the defined types were placed in this  encounter.   Janyth Pupa, DO Attending [place], [facility] for [employer], [place]

## 2021-11-28 DIAGNOSIS — Z419 Encounter for procedure for purposes other than remedying health state, unspecified: Secondary | ICD-10-CM | POA: Diagnosis not present

## 2021-12-02 ENCOUNTER — Other Ambulatory Visit: Payer: Self-pay | Admitting: Obstetrics & Gynecology

## 2021-12-02 DIAGNOSIS — O24410 Gestational diabetes mellitus in pregnancy, diet controlled: Secondary | ICD-10-CM

## 2021-12-03 ENCOUNTER — Other Ambulatory Visit (HOSPITAL_COMMUNITY)
Admission: RE | Admit: 2021-12-03 | Discharge: 2021-12-03 | Disposition: A | Discharge status: Home / Self Care | Payer: Medicaid Other | Source: Ambulatory Visit | Attending: Women's Health | Admitting: Women's Health

## 2021-12-03 ENCOUNTER — Encounter: Payer: Self-pay | Admitting: Women's Health

## 2021-12-03 ENCOUNTER — Ambulatory Visit (INDEPENDENT_AMBULATORY_CARE_PROVIDER_SITE_OTHER): Payer: Medicaid Other

## 2021-12-03 ENCOUNTER — Ambulatory Visit (INDEPENDENT_AMBULATORY_CARE_PROVIDER_SITE_OTHER): Payer: Medicaid Other | Admitting: Women's Health

## 2021-12-03 ENCOUNTER — Other Ambulatory Visit: Payer: Self-pay

## 2021-12-03 VITALS — BP 129/79 | HR 96 | Wt 176.0 lb

## 2021-12-03 DIAGNOSIS — O24410 Gestational diabetes mellitus in pregnancy, diet controlled: Secondary | ICD-10-CM

## 2021-12-03 DIAGNOSIS — Z3A36 36 weeks gestation of pregnancy: Secondary | ICD-10-CM

## 2021-12-03 DIAGNOSIS — O0990 Supervision of high risk pregnancy, unspecified, unspecified trimester: Secondary | ICD-10-CM

## 2021-12-03 DIAGNOSIS — F1290 Cannabis use, unspecified, uncomplicated: Secondary | ICD-10-CM

## 2021-12-03 DIAGNOSIS — O0993 Supervision of high risk pregnancy, unspecified, third trimester: Secondary | ICD-10-CM

## 2021-12-03 LAB — POCT URINALYSIS DIPSTICK OB
Blood, UA: NEGATIVE
Glucose, UA: NEGATIVE
Ketones, UA: NEGATIVE
Leukocytes, UA: NEGATIVE
Nitrite, UA: NEGATIVE
POC,PROTEIN,UA: NEGATIVE

## 2021-12-03 LAB — OB RESULTS CONSOLE GC/CHLAMYDIA: Gonorrhea: NEGATIVE

## 2021-12-03 NOTE — Progress Notes (Signed)
[place] 36+2 wks,cephalic,anterior placenta gr 3,AFI 11.4 cm,normal ovaries,fhr 133 BPM,EFW 3059 g 69%

## 2021-12-03 NOTE — Progress Notes (Signed)
HIGH-RISK PREGNANCY VISIT Patient name: Emily [name] MRN [id number]  Date of birth: 03/25/1991 Chief Complaint:   High Risk Gestation (GBS, GC/CHL)  History of Present Illness:   Emily [name] is a 30 y.o. G65P1021 female at [redacted]w[redacted]d with an Estimated Date of Delivery: 12/29/21 being seen today for ongoing management of a high-risk pregnancy complicated by diabetes mellitus A1DM.    Today she reports  all sugars wnl . Contractions: Not present. Vag. Bleeding: None.  Movement: Present. denies leaking of fluid.   Depression screen [facility] 2/9 10/01/2021 06/27/2021 04/25/2021  Decreased Interest 1 1 1   Down, Depressed, Hopeless 0 0 1  PHQ - 2 Score 1 1 2   Altered sleeping 1 1 1   Tired, decreased energy 1 0 1  Change in appetite 1 2 0  Feeling bad or failure about yourself  0 0 0  Trouble concentrating 0 0 0  Moving slowly or fidgety/restless 0 0 0  Suicidal thoughts 0 0 0  PHQ-9 Score 4 4 4      GAD 7 : Generalized Anxiety Score 10/01/2021 06/27/2021 04/25/2021  Nervous, Anxious, on Edge 0 1 1  Control/stop worrying 1 1 1   Worry too much - different things 1 1 0  Trouble relaxing 1 1 0  Restless 0 0 0  Easily annoyed or irritable 0 0 1  Afraid - awful might happen 1 1 1   Total GAD 7 Score 4 5 4      Review of Systems:   Pertinent items are noted in HPI Denies abnormal vaginal discharge w/ itching/odor/irritation, headaches, visual changes, shortness of breath, chest pain, abdominal pain, severe nausea/vomiting, or problems with urination or bowel movements unless otherwise stated above. Pertinent History Reviewed:  Reviewed past medical,surgical, social, obstetrical and family history.  Reviewed problem list, medications and allergies. Physical Assessment:   Vitals:   12/03/21 1531  BP: 129/79  Pulse: 96  Weight: 176 lb (79.8 kg)  Body mass index is 32.19 kg/m.           Physical Examination:   General appearance: alert, well appearing, and in no distress  Mental status:  alert, oriented to person, place, and time  Skin: warm & dry   Extremities: Edema: Trace    Cardiovascular: normal heart rate noted  Respiratory: normal respiratory effort, no distress  Abdomen: gravid, soft, non-tender  Pelvic: Cervical exam performed  Dilation: 1 Effacement (%): 50 Station: -2  Fetal Status: Fetal Heart Rate (bpm): 133 u/s   Movement: Present Presentation: Vertex  Fetal Surveillance Testing today: 12/01/2021 36+2 wks,cephalic,anterior placenta gr 3,AFI 11.4 cm,normal ovaries,fhr 133 BPM,EFW 3059 g 69%  Chaperone: 06/29/2021    Results for orders placed or performed in visit on 12/03/21 (from the past 24 hour(s))  POC Urinalysis Dipstick OB   Collection Time: 12/03/21  3:32 PM  Result Value Ref Range   Color, UA     Clarity, UA     Glucose, UA Negative Negative   Bilirubin, UA     Ketones, UA neg    Spec Grav, UA     Blood, UA neg    pH, UA     POC,PROTEIN,UA Negative Negative, Trace, Small (1+), Moderate (2+), Large (3+), 4+   Urobilinogen, UA     Nitrite, UA neg    Leukocytes, UA Negative Negative   Appearance     Odor      Assessment & Plan:  High-risk pregnancy: at [redacted]w[redacted]d with an Estimated Date of Delivery: 12/29/21  1) A1DM, stable, EFW today 69%  Meds: No orders of the defined types were placed in this encounter.   Labs/procedures today: GBS, GC/CT, SVE, and U/S  Treatment Plan:  IOL 39-40wks  Reviewed: Preterm labor symptoms and general obstetric precautions including but not limited to vaginal bleeding, contractions, leaking of fluid and fetal movement were reviewed in detail with the patient.  All questions were answered. Does have home bp cuff. Office bp cuff given: not applicable. Check bp weekly, let us know if consistently >140 and/or >90.  Follow-up: Return for As scheduled.   Future Appointments  Date Time Provider [place]  12/10/2021  2:10 PM Janyth Pupa, DO [facility]  12/17/2021 11:50 AM Eure, Mertie Clause, MD [facility]    Orders Placed This Encounter  Procedures   Strep Gp B NAA   POC Urinalysis Dipstick OB   Roma Schanz CNM, [facility] 12/03/2021 3:41 PM

## 2021-12-03 NOTE — Patient Instructions (Signed)
[place], thank you for choosing our office today! We appreciate the opportunity to meet your healthcare needs. You may receive a short survey by mail, e-mail, or through MyChart. If you are happy with your care we would appreciate if you could take just a few minutes to complete the survey questions. We read all of your comments and take your feedback very seriously. Thank you again for choosing our office.  [facility]  [facility] ([address] [place], [place] [address]) Entrance C, located off of [address] Free 24/7 valet parking   CLASSES: Go to [email or web address] to register for classes (childbirth, breastfeeding, waterbirth, infant CPR, daddy bootcamp, etc.)  Call the office ([phone]) or go to [facility] if: You begin to have strong, frequent contractions Your water breaks.  Sometimes it is a big gush of fluid, sometimes it is just a trickle that keeps getting your panties wet or running down your legs You have vaginal bleeding.  It is normal to have a small amount of spotting if your cervix was checked.  You don't feel your baby moving like normal.  If you don't, get you something to eat and drink and lay down and focus on feeling your baby move.   If your baby is still not moving like normal, you should call the office or go to [facility].  Call the office ([phone]) or [facility] to [facility] for these signs of pre-eclampsia: Severe headache that does not go away with Tylenol Visual changes- seeing spots, double, blurred vision Pain under your right breast or upper abdomen that does not go away with Tums or heartburn medicine Nausea and/or vomiting Severe swelling in your hands, feet, and face   Tdap Vaccine It is recommended that you get the Tdap vaccine during the third trimester of EACH pregnancy to help protect your baby from getting pertussis (whooping cough) 27-36 weeks is the BEST time to do  this so that you can pass the protection on to your baby. During pregnancy is better than after pregnancy, but if you are unable to get it during pregnancy it will be offered at the hospital.  You can get this vaccine with us, at the health department, your family doctor, or some local pharmacies Everyone who will be around your baby should also be up-to-date on their vaccines before the baby comes. Adults (who are not pregnant) only need 1 dose of Tdap during adulthood.   [place] Pediatricians/Family Doctors [facility] ([facility]): [address], [phone]           [facility]: [address], [phone]                [facility] ([facility]): [address], [phone] (call to ask if accepting patients) [facility]: [address], [place], [phone]    [place] Pediatricians/Family Doctors [facility] ([facility]): [address], [phone] [facility]: [address], [phone] [facility]: [address], [phone]  [place] [facility]  [facility] ([facility]): [phone] [facility]: [address], [phone]   [place] Family Doctors [facility]: [address], [phone]  [place] [facility]  [facility]: [address], [phone]  Home Blood Pressure Monitoring for Patients   Your provider has recommended that you check your   blood pressure (BP) at least once a week at home. If you do not have a blood pressure cuff at home, one will be provided for you. Contact your provider if you have not received your monitor within 1 week.   Helpful Tips for Accurate Home Blood Pressure Checks  Don't smoke, exercise, or drink caffeine 30 minutes before checking your BP Use the restroom before checking your BP (a full bladder can raise your  pressure) Relax in a comfortable upright chair Feet on the ground Left arm resting comfortably on a flat surface at the level of your heart Legs uncrossed Back supported Sit quietly and don't talk Place the cuff on your bare arm Adjust snuggly, so that only two fingertips can fit between your skin and the top of the cuff Check 2 readings separated by at least one minute Keep a log of your BP readings For a visual, please reference this diagram: http://ccnc.care/bpdiagram  Provider Name: [facility]     Phone: [phone]  Zone 1: ALL CLEAR  Continue to monitor your symptoms:  BP reading is less than 140 (top number) or less than 90 (bottom number)  No right upper stomach pain No headaches or seeing spots No feeling nauseated or throwing up No swelling in face and hands  Zone 2: CAUTION Call your doctor's office for any of the following:  BP reading is greater than 140 (top number) or greater than 90 (bottom number)  Stomach pain under your ribs in the middle or right side Headaches or seeing spots Feeling nauseated or throwing up Swelling in face and hands  Zone 3: EMERGENCY  Seek immediate medical care if you have any of the following:  BP reading is greater than160 (top number) or greater than 110 (bottom number) Severe headaches not improving with Tylenol Serious difficulty catching your breath Any worsening symptoms from Zone 2  Preterm Labor and Birth Information  The normal length of a pregnancy is 39-41 weeks. Preterm labor is when labor starts before 37 completed weeks of pregnancy. What are the risk factors for preterm labor? Preterm labor is more likely to occur in women who: Have certain infections during pregnancy such as a bladder infection, sexually transmitted infection, or infection inside the uterus (chorioamnionitis). Have a shorter-than-normal cervix. Have gone into preterm labor before. Have had surgery on their cervix. Are younger than age 17  or older than age 35. Are African American. Are pregnant with twins or multiple babies (multiple gestation). Take street drugs or smoke while pregnant. Do not gain enough weight while pregnant. Became pregnant shortly after having been pregnant. What are the symptoms of preterm labor? Symptoms of preterm labor include: Cramps similar to those that can happen during a menstrual period. The cramps may happen with diarrhea. Pain in the abdomen or lower back. Regular uterine contractions that may feel like tightening of the abdomen. A feeling of increased pressure in the pelvis. Increased watery or bloody mucus discharge from the vagina. Water breaking (ruptured amniotic sac). Why is it important to recognize signs of preterm labor? It is important to recognize signs of preterm labor because babies who are born prematurely may not be fully developed. This can put them at an increased risk for: Long-term (chronic) heart and lung problems. Difficulty immediately after birth with regulating body systems, including blood sugar, body temperature, heart rate, and breathing rate. Bleeding in the brain. Cerebral palsy. Learning difficulties. Death. These risks are highest for babies who are born before 34 weeks   of pregnancy. How is preterm labor treated? Treatment depends on the length of your pregnancy, your condition, and the health of your baby. It may involve: Having a stitch (suture) placed in your cervix to prevent your cervix from opening too early (cerclage). Taking or being given medicines, such as: Hormone medicines. These may be given early in pregnancy to help support the pregnancy. Medicine to stop contractions. Medicines to help mature the baby's lungs. These may be prescribed if the risk of delivery is high. Medicines to prevent your baby from developing cerebral palsy. If the labor happens before 34 weeks of pregnancy, you may need to stay in the hospital. What should I do if I  think I am in preterm labor? If you think that you are going into preterm labor, call your health care provider right away. How can I prevent preterm labor in future pregnancies? To increase your chance of having a full-term pregnancy: Do not use any tobacco products, such as cigarettes, chewing tobacco, and e-cigarettes. If you need help quitting, ask your health care provider. Do not use street drugs or medicines that have not been prescribed to you during your pregnancy. Talk with your health care provider before taking any herbal supplements, even if you have been taking them regularly. Make sure you gain a healthy amount of weight during your pregnancy. Watch for infection. If you think that you might have an infection, get it checked right away. Make sure to tell your health care provider if you have gone into preterm labor before. This information is not intended to replace advice given to you by your health care provider. Make sure you discuss any questions you have with your health care provider. Document Revised: 04/08/2019 Document Reviewed: 05/07/2016 Elsevier Patient Education  2020 Elsevier Inc.   

## 2021-12-05 LAB — CERVICOVAGINAL ANCILLARY ONLY
Chlamydia: NEGATIVE
Comment: NEGATIVE
Comment: NORMAL
Neisseria Gonorrhea: NEGATIVE

## 2021-12-05 LAB — STREP GP B NAA: Strep Gp B NAA: NEGATIVE

## 2021-12-06 ENCOUNTER — Encounter: Payer: Self-pay | Admitting: Women's Health

## 2021-12-10 ENCOUNTER — Other Ambulatory Visit: Payer: Self-pay

## 2021-12-10 ENCOUNTER — Ambulatory Visit (INDEPENDENT_AMBULATORY_CARE_PROVIDER_SITE_OTHER): Payer: Medicaid Other | Admitting: Obstetrics & Gynecology

## 2021-12-10 ENCOUNTER — Encounter: Payer: Self-pay | Admitting: Obstetrics & Gynecology

## 2021-12-10 VITALS — BP 132/78 | HR 80 | Wt 178.0 lb

## 2021-12-10 DIAGNOSIS — Z3A37 37 weeks gestation of pregnancy: Secondary | ICD-10-CM

## 2021-12-10 DIAGNOSIS — O0990 Supervision of high risk pregnancy, unspecified, unspecified trimester: Secondary | ICD-10-CM

## 2021-12-10 LAB — POCT URINALYSIS DIPSTICK OB
Glucose, UA: NEGATIVE
Ketones, UA: NEGATIVE
Leukocytes, UA: NEGATIVE
Nitrite, UA: NEGATIVE
POC,PROTEIN,UA: NEGATIVE

## 2021-12-10 NOTE — Progress Notes (Signed)
[place] PREGNANCY VISIT Patient name: Emily [name] MRN [id number]  Date of birth: [birth year] Chief Complaint:   High Risk Gestation  History of Present Illness:   Emily [name] is a 30 y.o. G75P1021 female at 60w2dwith an Estimated Date of Delivery: 12/29/21 being seen today for ongoing management of a high-risk pregnancy complicated by -GDMA1- pt does not have log, strongly encouraged to bring every visit Per pt remain controlled.    Today she reports  lost her mucus plug . Notes only occasional contraction.  Contractions: Not present. Vag. Bleeding: None.  Movement: Present. denies leaking of fluid.   [facility] 10/01/2021 06/27/2021 04/25/2021  Decreased Interest '1 1 1  ' Down, Depressed, Hopeless 0 0 1  PHQ - 2 Score '1 1 2  ' Altered sleeping '1 1 1  ' Tired, decreased energy 1 0 1  Change in appetite 1 2 0  Feeling bad or failure about yourself  0 0 0  Trouble concentrating 0 0 0  Moving slowly or fidgety/restless 0 0 0  Suicidal thoughts 0 0 0  PHQ-9 Score '4 4 4     ' Current Outpatient Medications  Medication Instructions   Accu-Chek Softclix Lancets lancets Check blood sugar four times a day.   acetaminophen (TYLENOL) 1,000 mg, Oral, Every 6 hours PRN   aspirin 81 mg, Oral, Daily, Swallow whole.   Blood Glucose Monitoring Suppl (ACCU-CHEK GUIDE) w/Device KIT 1 kit, Does not apply, 4 times daily   Blood Pressure Monitor MISC For regular home bp monitoring during pregnancy   Doxylamine-Pyridoxine (DICLEGIS) 10-10 MG TBEC Take 2 qhs; may also take one in am and one in afternoon prn nausea   glucose blood (ACCU-CHEK GUIDE) test strip Check blood sugar four times daily.   ondansetron (ZOFRAN ODT) 4 mg, Oral, Every 6 hours PRN   Prenatal Vit-Fe Fumarate-FA (PRENATAL VITAMIN PO) Oral   promethazine (PHENERGAN) 25 mg, Oral, Every 6 hours PRN     Review of Systems:   Pertinent items are noted in HPI Denies abnormal vaginal discharge w/ itching/odor/irritation,  headaches, visual changes, shortness of breath, chest pain, abdominal pain, severe nausea/vomiting, or problems with urination or bowel movements unless otherwise stated above. Pertinent History Reviewed:  Reviewed past medical,surgical, social, obstetrical and family history.  Reviewed problem list, medications and allergies. Physical Assessment:   Vitals:   12/10/21 1418  BP: 132/78  Pulse: 80  Weight: 178 lb (80.7 kg)  Body mass index is 32.56 kg/m.           Physical Examination:   General appearance: alert, well appearing, and in no distress  Mental status: normal mood, behavior, speech, dress, motor activity, and thought processes  Skin: warm & dry   Extremities: Edema: Trace    Cardiovascular: normal heart rate noted  Respiratory: normal respiratory effort, no distress  Abdomen: gravid, soft, non-tender  Pelvic: Cervical exam performed  Dilation: 1.5 Effacement (%): 50 Station: -2  Fetal Status: Fetal Heart Rate (bpm): 150 Fundal Height: 36 cm Movement: Present Presentation: Vertex  Fetal Surveillance Testing today: doppler   Chaperone:  Dr. SNancy Fetter    Results for orders placed or performed in visit on 12/10/21 (from the past 24 hour(s))  POC Urinalysis Dipstick OB   Collection Time: 12/10/21  2:22 PM  Result Value Ref Range   Color, UA     Clarity, UA     Glucose, UA Negative Negative   Bilirubin, UA     Ketones, UA neg  Spec Grav, UA     Blood, UA trace    pH, UA     POC,PROTEIN,UA Negative Negative, Trace, Small (1+), Moderate (2+), Large (3+), 4+   Urobilinogen, UA     Nitrite, UA neg    Leukocytes, UA Negative Negative   Appearance     Odor       Assessment & Plan:  High-risk pregnancy: [id number] at 70w2dwith an Estimated Date of Delivery: 12/29/21   1) GDMA1- diet controlled -last [place]orea@ 36wk  Meds: No orders of the defined types were placed in this encounter.   Labs/procedures today: none  Treatment Plan:  continue routine care, plan for IOL  39-40wk  Reviewed: Term labor symptoms and general obstetric precautions including but not limited to vaginal bleeding, contractions, leaking of fluid and fetal movement were reviewed in detail with the patient.  All questions were answered.   Follow-up: Return in about 1 week (around 12/17/2021) for LDobbinsvisit.   Future Appointments  Date Time Provider DCarpenter 12/17/2021 11:50 AM EFlorian Buff MD [facility]  12/24/2021  2:10 PM BRoma Schanz CNM [facility]    Orders Placed This Encounter  Procedures   POC Urinalysis Dipstick OB    JJanyth Pupa DO Attending OCarnuel [facility] WDe[employer] [facility] [place]

## 2021-12-17 ENCOUNTER — Other Ambulatory Visit: Payer: Self-pay

## 2021-12-17 ENCOUNTER — Ambulatory Visit (INDEPENDENT_AMBULATORY_CARE_PROVIDER_SITE_OTHER): Payer: Medicaid Other | Admitting: Women's Health

## 2021-12-17 ENCOUNTER — Encounter: Payer: Self-pay | Admitting: Women's Health

## 2021-12-17 VITALS — BP 142/91 | HR 96 | Wt 176.0 lb

## 2021-12-17 DIAGNOSIS — R030 Elevated blood-pressure reading, without diagnosis of hypertension: Secondary | ICD-10-CM

## 2021-12-17 DIAGNOSIS — O0993 Supervision of high risk pregnancy, unspecified, third trimester: Secondary | ICD-10-CM

## 2021-12-17 DIAGNOSIS — O24410 Gestational diabetes mellitus in pregnancy, diet controlled: Secondary | ICD-10-CM

## 2021-12-17 LAB — POCT URINALYSIS DIPSTICK OB
Blood, UA: NEGATIVE
Glucose, UA: NEGATIVE
Ketones, UA: NEGATIVE
Leukocytes, UA: NEGATIVE
Nitrite, UA: NEGATIVE

## 2021-12-17 NOTE — Patient Instructions (Signed)
[place], thank you for choosing our office today! We appreciate the opportunity to meet your healthcare needs. You may receive a short survey by mail, e-mail, or through MyChart. If you are happy with your care we would appreciate if you could take just a few minutes to complete the survey questions. We read all of your comments and take your feedback very seriously. Thank you again for choosing our office.  [facility]  [facility] ([address] [place], [place] [address]) Entrance C, located off of [address] Free 24/7 valet parking   CLASSES: Go to [email or web address] to register for classes (childbirth, breastfeeding, waterbirth, infant CPR, daddy bootcamp, etc.)  Call the office ([phone]) or go to [facility] if: You begin to have strong, frequent contractions Your water breaks.  Sometimes it is a big gush of fluid, sometimes it is just a trickle that keeps getting your panties wet or running down your legs You have vaginal bleeding.  It is normal to have a small amount of spotting if your cervix was checked.  You don't feel your baby moving like normal.  If you don't, get you something to eat and drink and lay down and focus on feeling your baby move.   If your baby is still not moving like normal, you should call the office or go to [facility].  Call the office ([phone]) or [facility] for these signs of pre-eclampsia: Severe headache that does not go away with Tylenol Visual changes- seeing spots, double, blurred vision Pain under your right breast or upper abdomen that does not go away with Tums or heartburn medicine Nausea and/or vomiting Severe swelling in your hands, feet, and face   [place] Pediatricians/Family Doctors [facility] ([facility]): [address], [phone]           [facility]: [address], [phone]                 [facility] ([facility]): [address], [phone] (call to ask if accepting patients) [facility]: [address], [place], [phone]    [place] Pediatricians/Family Doctors [facility] ([facility]): [address], [phone] [facility]: [address], [phone] [facility]: [address], [phone]  [place] [facility]  [facility] ([facility]): [phone] [facility]: [address], [phone]   [place] Family Doctors [facility]: [address], [phone]  [place] [facility]  [facility]: [address], [phone]  Home Blood Pressure Monitoring for Patients   Your provider has recommended that you check your blood pressure (BP) at least once a week at home. If you do not have a blood pressure cuff at home, one will be provided for you. Contact your provider if you have not received your monitor within 1 week.   Helpful Tips for Accurate Home Blood Pressure Checks  Don't smoke, exercise, or drink caffeine 30 minutes before checking your BP Use the restroom before checking your BP (a full bladder can raise your pressure) Relax in a comfortable upright chair Feet on the ground Left arm resting comfortably on a flat surface at the level of your heart Legs uncrossed Back supported Sit quietly and don't talk Place the cuff on your bare arm Adjust snuggly, so that only two fingertips   can fit between your skin and the top of the cuff Check 2 readings separated by at least one minute Keep a log of your BP readings For a visual, please reference this diagram: http://ccnc.care/bpdiagram  Provider Name: [facility]     Phone: [phone]  Zone 1: ALL CLEAR  Continue to monitor your symptoms:  BP reading is less than 140 (top number) or less than 90 (bottom number)  No right  upper stomach pain No headaches or seeing spots No feeling nauseated or throwing up No swelling in face and hands  Zone 2: CAUTION Call your doctor's office for any of the following:  BP reading is greater than 140 (top number) or greater than 90 (bottom number)  Stomach pain under your ribs in the middle or right side Headaches or seeing spots Feeling nauseated or throwing up Swelling in face and hands  Zone 3: EMERGENCY  Seek immediate medical care if you have any of the following:  BP reading is greater than160 (top number) or greater than 110 (bottom number) Severe headaches not improving with Tylenol Serious difficulty catching your breath Any worsening symptoms from Zone 2   Braxton Hicks Contractions Contractions of the uterus can occur throughout pregnancy, but they are not always a sign that you are in labor. You may have practice contractions called Braxton Hicks contractions. These false labor contractions are sometimes confused with true labor. What are Braxton Hicks contractions? Braxton Hicks contractions are tightening movements that occur in the muscles of the uterus before labor. Unlike true labor contractions, these contractions do not result in opening (dilation) and thinning of the cervix. Toward the end of pregnancy (32-34 weeks), Braxton Hicks contractions can happen more often and may become stronger. These contractions are sometimes difficult to tell apart from true labor because they can be very uncomfortable. You should not feel embarrassed if you go to the hospital with false labor. Sometimes, the only way to tell if you are in true labor is for your health care provider to look for changes in the cervix. The health care provider will do a physical exam and may monitor your contractions. If you are not in true labor, the exam should show that your cervix is not dilating and your water has not broken. If there are no other health problems associated with your  pregnancy, it is completely safe for you to be sent home with false labor. You may continue to have Braxton Hicks contractions until you go into true labor. How to tell the difference between true labor and false labor True labor Contractions last 30-70 seconds. Contractions become very regular. Discomfort is usually felt in the top of the uterus, and it spreads to the lower abdomen and low back. Contractions do not go away with walking. Contractions usually become more intense and increase in frequency. The cervix dilates and gets thinner. False labor Contractions are usually shorter and not as strong as true labor contractions. Contractions are usually irregular. Contractions are often felt in the front of the lower abdomen and in the groin. Contractions may go away when you walk around or change positions while lying down. Contractions get weaker and are shorter-lasting as time goes on. The cervix usually does not dilate or become thin. Follow these instructions at home:  Take over-the-counter and prescription medicines only as told by your health care provider. Keep up with your usual exercises and follow other instructions from your health care provider. Eat and drink lightly if you think   you are going into labor. If Braxton Hicks contractions are making you uncomfortable: Change your position from lying down or resting to walking, or change from walking to resting. Sit and rest in a tub of warm water. Drink enough fluid to keep your urine pale yellow. Dehydration may cause these contractions. Do slow and deep breathing several times an hour. Keep all follow-up prenatal visits as told by your health care provider. This is important. Contact a health care provider if: You have a fever. You have continuous pain in your abdomen. Get help right away if: Your contractions become stronger, more regular, and closer together. You have fluid leaking or gushing from your vagina. You pass  blood-tinged mucus (bloody show). You have bleeding from your vagina. You have low back pain that you never had before. You feel your baby's head pushing down and causing pelvic pressure. Your baby is not moving inside you as much as it used to. Summary Contractions that occur before labor are called Braxton Hicks contractions, false labor, or practice contractions. Braxton Hicks contractions are usually shorter, weaker, farther apart, and less regular than true labor contractions. True labor contractions usually become progressively stronger and regular, and they become more frequent. Manage discomfort from Braxton Hicks contractions by changing position, resting in a warm bath, drinking plenty of water, or practicing deep breathing. This information is not intended to replace advice given to you by your health care provider. Make sure you discuss any questions you have with your health care provider. Document Revised: 11/27/2017 Document Reviewed: 04/30/2017 Elsevier Patient Education  2020 Elsevier Inc.   

## 2021-12-17 NOTE — Progress Notes (Signed)
HIGH-RISK PREGNANCY VISIT Patient name: Emily [name] MRN [id number]  Date of birth: 07-16-1991 Chief Complaint:   Routine Prenatal Visit and High Risk Gestation  History of Present Illness:   Emily [name] is a 30 y.o. G68P1021 female at [redacted]w[redacted]d with an Estimated Date of Delivery: 12/29/21 being seen today for ongoing management of a high-risk pregnancy complicated by diabetes mellitus A1DM.    Today she reports  all sugars wnl . Denies visual changes, ruq/epigastric pain, n/v. Had headache yesterday, went away on its own.   Contractions: Not present. Vag. Bleeding: None.  Movement: Present. denies leaking of fluid.   Depression screen [facility] 2/9 10/01/2021 06/27/2021 04/25/2021  Decreased Interest 1 1 1   Down, Depressed, Hopeless 0 0 1  PHQ - 2 Score 1 1 2   Altered sleeping 1 1 1   Tired, decreased energy 1 0 1  Change in appetite 1 2 0  Feeling bad or failure about yourself  0 0 0  Trouble concentrating 0 0 0  Moving slowly or fidgety/restless 0 0 0  Suicidal thoughts 0 0 0  PHQ-9 Score 4 4 4      GAD 7 : Generalized Anxiety Score 10/01/2021 06/27/2021 04/25/2021  Nervous, Anxious, on Edge 0 1 1  Control/stop worrying 1 1 1   Worry too much - different things 1 1 0  Trouble relaxing 1 1 0  Restless 0 0 0  Easily annoyed or irritable 0 0 1  Afraid - awful might happen 1 1 1   Total GAD 7 Score 4 5 4      Review of Systems:   Pertinent items are noted in HPI Denies abnormal vaginal discharge w/ itching/odor/irritation, headaches, visual changes, shortness of breath, chest pain, abdominal pain, severe nausea/vomiting, or problems with urination or bowel movements unless otherwise stated above. Pertinent History Reviewed:  Reviewed past medical,surgical, social, obstetrical and family history.  Reviewed problem list, medications and allergies. Physical Assessment:   Vitals:   12/17/21 1215 12/17/21 1217  BP: (!) 148/86 (!) 142/91  Pulse: 86 96  Weight: 176 lb (79.8 kg)   Body mass  index is 32.19 kg/m.           Physical Examination:   General appearance: alert, well appearing, and in no distress  Mental status: alert, oriented to person, place, and time  Skin: warm & dry   Extremities: Edema: Trace    Cardiovascular: normal heart rate noted  Respiratory: normal respiratory effort, no distress  Abdomen: gravid, soft, non-tender  Pelvic: Cervical exam performed  Dilation: 2 Effacement (%): 50 Station: -2  Fetal Status: Fetal Heart Rate (bpm): 135 Fundal Height: 38 cm Movement: Present Presentation: Vertex  Fetal Surveillance Testing today: doppler   Chaperone: 06/29/2021    Results for orders placed or performed in visit on 12/17/21 (from the past 24 hour(s))  POC Urinalysis Dipstick OB   Collection Time: 12/17/21 12:17 PM  Result Value Ref Range   Color, UA     Clarity, UA     Glucose, UA Negative Negative   Bilirubin, UA     Ketones, UA neg    Spec Grav, UA     Blood, UA neg    pH, UA     POC,PROTEIN,UA Trace Negative, Trace, Small (1+), Moderate (2+), Large (3+), 4+   Urobilinogen, UA     Nitrite, UA neg    Leukocytes, UA Negative Negative   Appearance     Odor      Assessment & Plan:  High-risk pregnancy: [id number] at [redacted]w[redacted]d with an Estimated Date of Delivery: 12/29/21   1) A1DM, stable, EFW 69% @ 36wk  2) Elevated bp, no h/o HTN, tr protein, asymptomatic. Discussed w/ LHE, bring back tomorrow AM for bp check w/ nurse, if still elevated will go to [facility] for [facility]OL. Reviewed pre-e s/s, reasons to seek care overnight  Meds: No orders of the defined types were placed in this encounter.   Labs/procedures today: SVE  Treatment Plan:  tomorrow am bp check  Reviewed: Term labor symptoms and general obstetric precautions including but not limited to vaginal bleeding, contractions, leaking of fluid and fetal movement were reviewed in detail with the patient.  All questions were answered.  Follow-up: Return for tomorrow AM bp check w/ nurse.   Future  Appointments  Date Time Provider [place]  12/24/2021  2:10 PM Roma Schanz, CNM [facility]    Orders Placed This Encounter  Procedures   POC Urinalysis Dipstick OB   [place], [facility] 12/17/2021 12:52 PM

## 2021-12-18 ENCOUNTER — Encounter: Payer: Self-pay | Admitting: *Deleted

## 2021-12-18 ENCOUNTER — Encounter (HOSPITAL_COMMUNITY): Payer: Self-pay | Admitting: Obstetrics and Gynecology

## 2021-12-18 ENCOUNTER — Encounter: Payer: Self-pay | Admitting: Women's Health

## 2021-12-18 ENCOUNTER — Ambulatory Visit (INDEPENDENT_AMBULATORY_CARE_PROVIDER_SITE_OTHER): Payer: Medicaid Other | Admitting: *Deleted

## 2021-12-18 ENCOUNTER — Inpatient Hospital Stay (HOSPITAL_COMMUNITY)
Admission: AD | Admit: 2021-12-18 | Discharge: 2021-12-21 | DRG: 807 | Disposition: A | Discharge status: Home / Self Care | Payer: Medicaid Other | Attending: Family Medicine | Admitting: Family Medicine

## 2021-12-18 ENCOUNTER — Other Ambulatory Visit: Payer: Self-pay

## 2021-12-18 VITALS — BP 130/82 | HR 95 | Ht 62.0 in | Wt 175.0 lb

## 2021-12-18 DIAGNOSIS — Z87891 Personal history of nicotine dependence: Secondary | ICD-10-CM | POA: Diagnosis not present

## 2021-12-18 DIAGNOSIS — O134 Gestational [pregnancy-induced] hypertension without significant proteinuria, complicating childbirth: Secondary | ICD-10-CM | POA: Diagnosis not present

## 2021-12-18 DIAGNOSIS — O24420 Gestational diabetes mellitus in childbirth, diet controlled: Secondary | ICD-10-CM | POA: Diagnosis not present

## 2021-12-18 DIAGNOSIS — O0990 Supervision of high risk pregnancy, unspecified, unspecified trimester: Secondary | ICD-10-CM | POA: Diagnosis not present

## 2021-12-18 DIAGNOSIS — O133 Gestational [pregnancy-induced] hypertension without significant proteinuria, third trimester: Secondary | ICD-10-CM | POA: Diagnosis not present

## 2021-12-18 DIAGNOSIS — Z20822 Contact with and (suspected) exposure to covid-19: Secondary | ICD-10-CM | POA: Diagnosis present

## 2021-12-18 DIAGNOSIS — Z8632 Personal history of gestational diabetes: Secondary | ICD-10-CM | POA: Diagnosis present

## 2021-12-18 DIAGNOSIS — D563 Thalassemia minor: Secondary | ICD-10-CM | POA: Diagnosis present

## 2021-12-18 DIAGNOSIS — Z3A38 38 weeks gestation of pregnancy: Secondary | ICD-10-CM

## 2021-12-18 DIAGNOSIS — Z7982 Long term (current) use of aspirin: Secondary | ICD-10-CM

## 2021-12-18 DIAGNOSIS — Z3480 Encounter for supervision of other normal pregnancy, unspecified trimester: Secondary | ICD-10-CM

## 2021-12-18 DIAGNOSIS — F1290 Cannabis use, unspecified, uncomplicated: Secondary | ICD-10-CM

## 2021-12-18 DIAGNOSIS — O285 Abnormal chromosomal and genetic finding on antenatal screening of mother: Secondary | ICD-10-CM | POA: Diagnosis present

## 2021-12-18 HISTORY — DX: Gestational diabetes mellitus in pregnancy, unspecified control: O24.419

## 2021-12-18 LAB — PROTEIN / CREATININE RATIO, URINE
Creatinine, Urine: 52.97 mg/dL
Protein Creatinine Ratio: 0.13 mg/mg{Cre} (ref 0.00–0.15)
Total Protein, Urine: 7 mg/dL

## 2021-12-18 LAB — CBC
HCT: 38.9 % (ref 36.0–46.0)
Hemoglobin: 13.1 g/dL (ref 12.0–15.0)
MCH: 29.1 pg (ref 26.0–34.0)
MCHC: 33.7 g/dL (ref 30.0–36.0)
MCV: 86.4 fL (ref 80.0–100.0)
Platelets: 223 10*3/uL (ref 150–400)
RBC: 4.5 MIL/uL (ref 3.87–5.11)
RDW: 13.3 % (ref 11.5–15.5)
WBC: 11.4 10*3/uL — ABNORMAL HIGH (ref 4.0–10.5)
nRBC: 0 % (ref 0.0–0.2)

## 2021-12-18 LAB — RESP PANEL BY RT-PCR (FLU A&B, COVID) ARPGX2
Influenza A by PCR: NEGATIVE
Influenza B by PCR: NEGATIVE
SARS Coronavirus 2 by RT PCR: NEGATIVE

## 2021-12-18 LAB — TYPE AND SCREEN
ABO/RH(D): A POS
Antibody Screen: NEGATIVE

## 2021-12-18 LAB — GLUCOSE, CAPILLARY: Glucose-Capillary: 106 mg/dL — ABNORMAL HIGH (ref 70–99)

## 2021-12-18 MED ORDER — SOD CITRATE-CITRIC ACID 500-334 MG/5ML PO SOLN: 30.0000 mL | ORAL | Status: DC | PRN

## 2021-12-18 MED ORDER — OXYCODONE-ACETAMINOPHEN 5-325 MG PO TABS: 2.0000 | ORAL_TABLET | ORAL | Status: DC | PRN

## 2021-12-18 MED ORDER — MISOPROSTOL 50MCG HALF TABLET
50.0000 ug | ORAL_TABLET | ORAL | Status: DC | PRN
  Administered 2021-12-18: 50 ug via BUCCAL
  Filled 2021-12-18: qty 1

## 2021-12-18 MED ORDER — LIDOCAINE HCL (PF) 1 % IJ SOLN: 30.0000 mL | INTRAMUSCULAR | Status: DC | PRN

## 2021-12-18 MED ORDER — ACETAMINOPHEN 500 MG PO TABS
1000.0000 mg | ORAL_TABLET | Freq: Four times a day (QID) | ORAL | Status: DC | PRN
  Administered 2021-12-18 – 2021-12-19 (×2): 1000 mg via ORAL
  Filled 2021-12-18 (×2): qty 2

## 2021-12-18 MED ORDER — OXYTOCIN-SODIUM CHLORIDE 30-0.9 UT/500ML-% IV SOLN
2.5000 [IU]/h | INTRAVENOUS | Status: DC
  Filled 2021-12-18: qty 500

## 2021-12-18 MED ORDER — LACTATED RINGERS IV SOLN: INTRAVENOUS | Status: DC

## 2021-12-18 MED ORDER — ONDANSETRON HCL 4 MG/2ML IJ SOLN
4.0000 mg | Freq: Four times a day (QID) | INTRAMUSCULAR | Status: DC | PRN
  Administered 2021-12-19: 4 mg via INTRAVENOUS
  Filled 2021-12-18 (×2): qty 2

## 2021-12-18 MED ORDER — OXYCODONE-ACETAMINOPHEN 5-325 MG PO TABS: 1.0000 | ORAL_TABLET | ORAL | Status: DC | PRN

## 2021-12-18 MED ORDER — TERBUTALINE SULFATE 1 MG/ML IJ SOLN: 0.2500 mg | Freq: Once | INTRAMUSCULAR | Status: DC | PRN

## 2021-12-18 MED ORDER — FAMOTIDINE 20 MG PO TABS
20.0000 mg | ORAL_TABLET | Freq: Once | ORAL | Status: AC
  Administered 2021-12-18: 20 mg via ORAL
  Filled 2021-12-18: qty 1

## 2021-12-18 MED ORDER — ACETAMINOPHEN 325 MG PO TABS
650.0000 mg | ORAL_TABLET | ORAL | Status: DC | PRN
  Filled 2021-12-18: qty 2

## 2021-12-18 MED ORDER — LACTATED RINGERS IV SOLN: 500.0000 mL | INTRAVENOUS | Status: DC | PRN

## 2021-12-18 MED ORDER — OXYTOCIN BOLUS FROM INFUSION
333.0000 mL | Freq: Once | INTRAVENOUS | Status: AC
  Administered 2021-12-19: 17:00:00 333 mL via INTRAVENOUS

## 2021-12-18 NOTE — MAU Note (Signed)
B/Ps were up yesterday at office. Returned this am and b/p down. This evening woke up from nap and took b/p and it was 145/83. Was told to come in. Has had h/a all day. Took Tylenol earlier today and headache eased up some. Denies VB or LOF. Was 2cm today in office. Good FM

## 2021-12-18 NOTE — Progress Notes (Addendum)
° °  NURSE VISIT- BLOOD PRESSURE CHECK  SUBJECTIVE:  Emily [name] is a 30 y.o. G10P1021 female here for BP check. She is [redacted]w[redacted]d pregnant    HYPERTENSION ROS:  Pregnant/postpartum:  Severe headaches that don't go away with tylenol/other medicines: Yes  Visual changes (seeing spots/double/blurred vision) No  Severe pain under right breast breast or in center of upper chest No  Severe nausea/vomiting Yes  Taking medicines as instructed not applicable    OBJECTIVE:  BP 130/82    Pulse 95    Ht 5\' 2"  (1.575 m)    Wt 175 lb (79.4 kg)    LMP  (LMP Unknown)    BMI 32.01 kg/m   Appearance alert, well appearing, and in no distress.  ASSESSMENT: Pregnancy [redacted]w[redacted]d  blood pressure check  PLAN: Discussed with Dr. [redacted]w[redacted]d   Recommendations: check pre-e labs today   Follow-up: as scheduled   Charlotta Newton  12/18/2021 9:49 AM  Chart reviewed for nurse visit. Agree with plan of care.  12/20/2021, DO 12/21/2021 11:54 AM

## 2021-12-18 NOTE — H&P (Signed)
OBSTETRIC ADMISSION HISTORY AND PHYSICAL Emily [name] is a 30 y.o. female [phone] with IUP at 12w3dby early [place] for IOL for gHTN (new diagnosis) and also has A1GDM. She reports +FMs, No LOF, no VB, no blurry vision, headaches or peripheral edema, and RUQ pain.  She plans on breast feeding. She request Nexplanon for birth control. [facility]  Dating: By early [place]--->  Estimated Date of Delivery: 12/29/21  Sono:   _0 , CWD, normal anatomy, cephalic presentation, anterior placental lie, 3059g, 69% EFW   Prenatal History/Complications:  [id number] (new diagnosis at admission)  Past Medical History: Past Medical History:  Diagnosis Date   Gestational diabetes     Past Surgical History: Past Surgical History:  Procedure Laterality Date   DILATION AND CURETTAGE OF UTERUS      Obstetrical History: OB History     Gravida  4   Para  1   Term  1   Preterm      AB  2   Living  1      SAB  2   IAB      Ectopic      Multiple      Live Births  1           Social History Social History   Socioeconomic History   Marital status: Significant Other    Spouse name: Not on file   Number of children: Not on file   Years of education: Not on file   Highest education level: Not on file  Occupational History   Not on file  Tobacco Use   Smoking status: Former    Packs/day: 1.00    Types: Cigarettes   Smokeless tobacco: Never  Vaping Use   Vaping Use: Never used  Substance and Sexual Activity   Alcohol use: Not Currently   Drug use: No   Sexual activity: Not Currently    Birth control/protection: None  Other Topics Concern   Not on file  Social History Narrative   Not on file   Social Determinants of Health   Financial Resource Strain: Low Risk    Difficulty of Paying Living Expenses: Not very hard  Food Insecurity: No Food Insecurity   Worried About Running Out of Food in the Last Year: Never true    [place] the Last Year: Never true  Transportation Needs: No Transportation Needs   Lack of Transportation (Medical): No   Lack of Transportation (Non-Medical): No  Physical Activity: Insufficiently Active   Days of Exercise per Week: 4 days   Minutes of Exercise per Session: 30 min  Stress: Stress Concern Present   Feeling of Stress : To some extent  Social Connections: Moderately Integrated   Frequency of Communication with Friends and Family: More than three times a week   Frequency of Social Gatherings with Friends and Family: Three times a week   Attends Religious Services: 1 to 4 times per year   Active Member of Clubs or Organizations: No   Attends CMusic therapist Never   Marital Status: Living with partner    Family History: Family History  Problem Relation Age of Onset   Healthy Mother    Healthy Father    Diabetes Maternal Grandfather     Allergies: No Known Allergies  Medications Prior to Admission  Medication Sig Dispense Refill Last Dose   acetaminophen (TYLENOL) 500 MG tablet Take 1,000  mg by mouth every 6 (six) hours as needed for moderate pain.   12/18/2021 at 1130   aspirin 81 MG EC tablet Take 1 tablet (81 mg total) by mouth daily. Swallow whole. 90 tablet 3 12/18/2021   Doxylamine-Pyridoxine (DICLEGIS) 10-10 MG TBEC Take 2 qhs; may also take one in am and one in afternoon prn nausea 120 tablet 6 12/18/2021   ondansetron (ZOFRAN ODT) 4 MG disintegrating tablet Take 1 tablet (4 mg total) by mouth every 6 (six) hours as needed for nausea. 30 tablet 2 12/18/2021   Prenatal Vit-Fe Fumarate-FA (PRENATAL VITAMIN PO) Take by mouth.   12/18/2021   promethazine (PHENERGAN) 25 MG tablet Take 1 tablet (25 mg total) by mouth every 6 (six) hours as needed for nausea or vomiting. 30 tablet 1 12/18/2021   Accu-Chek Softclix Lancets lancets Check blood sugar four times a day. 100 each 12    Blood Glucose Monitoring Suppl (ACCU-CHEK GUIDE) w/Device KIT  1 kit by Does not apply route 4 (four) times daily. 1 kit 0    Blood Pressure Monitor MISC For regular home bp monitoring during pregnancy (Patient not taking: Reported on 12/03/2021) 1 each 0    glucose blood (ACCU-CHEK GUIDE) test strip Check blood sugar four times daily. 50 each 12      Review of Systems   All systems reviewed and negative except as stated in HPI  Blood pressure (!) 155/82, pulse 84, temperature 97.6 F (36.4 C), temperature source Oral, resp. rate 18, height _0  (1.575 m), weight 80.3 kg, SpO2 98 %. General appearance: alert Lungs: clear to auscultation bilaterally Heart: regular rate and rhythm Abdomen: soft, non-tender; bowel sounds normal Extremities: Homans sign is negative, no sign of DVT Presentation: cephalic Fetal monitoringBaseline: 130 bpm, Variability: Good {> 6 bpm), Accelerations: Reactive, and Decelerations: Absent Uterine activityFrequency: irregular   Prenatal labs: ABO, Rh: --/--/A POS (12/21 2210) Antibody: NEG (12/21 2210) Rubella: 1.29 (06/22 1505) RPR: Non Reactive (10/04 0845)  HBsAg: Negative (06/22 1505)  HIV: Non Reactive (10/04 0845)  GBS: Negative/-- (12/06 1545)  2 hr Glucola abnormal Genetic screening  silent carrier for alpha thalassemia Anatomy US normal  Prenatal Transfer Tool  Maternal Diabetes: Yes:  Diabetes Type:  Diet controlled Genetic Screening: Abnormal:  Results: Other:[place] for alpha thalassemia Maternal Ultrasounds/Referrals: Normal Fetal Ultrasounds or other Referrals:  None Maternal Substance Abuse:  No Significant Maternal Medications:  None Significant Maternal Lab Results: Group B Strep negative  Results for orders placed or performed during the hospital encounter of 12/18/21 (from the past 24 hour(s))  Protein / creatinine ratio, urine   Collection Time: 12/18/21  9:25 PM  Result Value Ref Range   Creatinine, Urine 52.97 mg/dL   Total Protein, Urine 7 mg/dL   Protein Creatinine Ratio 0.13 0.00 - 0.15  mg/mg[Cre]  Resp Panel by RT-PCR (Flu A&B, Covid) Nasopharyngeal Swab   Collection Time: 12/18/21  9:57 PM   Specimen: Nasopharyngeal Swab; Nasopharyngeal(NP) swabs in vial transport medium  Result Value Ref Range   SARS Coronavirus 2 by RT PCR NEGATIVE NEGATIVE   Influenza A by PCR NEGATIVE NEGATIVE   Influenza B by PCR NEGATIVE NEGATIVE  CBC   Collection Time: 12/18/21 10:10 PM  Result Value Ref Range   WBC 11.4 (H) 4.0 - 10.5 K/uL   RBC 4.50 3.87 - 5.11 MIL/uL   Hemoglobin 13.1 12.0 - 15.0 g/dL   HCT 38.9 36.0 - 46.0 %   MCV 86.4 80.0 - 100.0 fL  MCH 29.1 26.0 - 34.0 pg   MCHC 33.7 30.0 - 36.0 g/dL   RDW 13.3 11.5 - 15.5 %   Platelets 223 150 - 400 K/uL   nRBC 0.0 0.0 - 0.2 %  Type and screen [place]   Collection Time: 12/18/21 10:10 PM  Result Value Ref Range   ABO/RH(D) A POS    Antibody Screen NEG    Sample Expiration      12/21/2021,2359 Performed at [facility], [place] [address] [place], [place] [address]   Glucose, capillary   Collection Time: 12/18/21 11:30 PM  Result Value Ref Range   Glucose-Capillary 106 (H) 70 - 99 mg/dL    Patient Active Problem List   Diagnosis Date Noted   Supervision of other normal pregnancy, antepartum 12/18/2021   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 G4P1021 at 73w3dhere for IOL for gHTN (new diagnosis) and also has A1GDM  #Labor:IOL for gHTN. Exam in office yesterday 2/50/-2. Today cervix is unchanged. Foley balloon placed and Cytotec 510m buccal started. #Pain: Plans epidural #FWB: Cat I #ID:  GBS neg #MOF: breast #MOC: Nexplanon (has old one - needs removal and reinsertion) #Circ:  N/A  #gHTN BP elevated  at 140s/90s. Had BP in 140s/90s yesterday during office visit. Now meets criteria for gHTN.  - IOL since gHTN at 38 weeks - preE labs sent at admission  #A1GDM Diet controlled. Will get BG at admission. If normal does not need routine checks in labor. Will get FBG on PPD#1   AnRenard MatterMD, MPH OB Fellow, Faculty Practice

## 2021-12-18 NOTE — MAU Provider Note (Signed)
Chief Complaint:  Hypertension   Event Date/Time   First Provider Initiated Contact with Patient 12/18/21 2145     HPI: Emily [name] is a 30 y.o. [id number] at 60w3dho presents to maternity admissions reporting elevated BP at home tonight with headache all day ("4") unrelieved by Tylenol.  She had her first elevated BP in office yesterday.  BP in office was 130/80 today. . She reports good fetal movement, denies LOF, vaginal bleeding, vaginal itching/burning, urinary symptoms, dizziness, n/v, diarrhea, constipation or fever/chills.  She denies visual changes or RUQ abdominal pain.  Hypertension This is a new problem. The current episode started yesterday. The problem is unchanged. Associated symptoms include headaches. Pertinent negatives include no anxiety, blurred vision or chest pain. There are no associated agents to hypertension. There are no known risk factors for coronary artery disease. Past treatments include nothing. There are no compliance problems.    RN Note: B/Ps were up yesterday at office. Returned this am and b/p down. This evening woke up from nap and took b/p and it was 145/83. Was told to come in. Has had h/a all day. Took Tylenol earlier today and headache eased up some. Denies VB or LOF. Was 2cm today in office. Good FM  Past Medical History: Past Medical History:  Diagnosis Date   Gestational diabetes     Past obstetric history: OB History  Gravida Para Term Preterm AB Living  _0 SAB IAB Ectopic Multiple Live Births  2       1    # Outcome Date GA Lbr Len/2nd Weight Sex Delivery Anes PTL Lv  4 Current           3 SAB 02/2021          2 Term 01/24/07 [id number] g M Vag-Spont EPI N LIV  1 SAB 2007            Past Surgical History: Past Surgical History:  Procedure Laterality Date   DILATION AND CURETTAGE OF UTERUS      Family History: Family History  Problem Relation Age of Onset   Healthy Mother    Healthy Father    Diabetes Maternal  Grandfather     Social History: Social History   Tobacco Use   Smoking status: Former    Packs/day: 1.00    Types: Cigarettes   Smokeless tobacco: Never  Vaping Use   Vaping Use: Never used  Substance Use Topics   Alcohol use: Not Currently   Drug use: No    Allergies: No Known Allergies  Meds:  Medications Prior to Admission  Medication Sig Dispense Refill Last Dose   acetaminophen (TYLENOL) 500 MG tablet Take 1,000 mg by mouth every 6 (six) hours as needed for moderate pain.   12/18/2021 at 1130   aspirin 81 MG EC tablet Take 1 tablet (81 mg total) by mouth daily. Swallow whole. 90 tablet 3 12/18/2021   Doxylamine-Pyridoxine (DICLEGIS) 10-10 MG TBEC Take 2 qhs; may also take one in am and one in afternoon prn nausea 120 tablet 6 12/18/2021   ondansetron (ZOFRAN ODT) 4 MG disintegrating tablet Take 1 tablet (4 mg total) by mouth every 6 (six) hours as needed for nausea. 30 tablet 2 12/18/2021   Prenatal Vit-Fe Fumarate-FA (PRENATAL VITAMIN PO) Take by mouth.   12/18/2021   promethazine (PHENERGAN) 25 MG tablet Take 1 tablet (25 mg total) by mouth every 6 (six) hours as needed for nausea or  vomiting. 30 tablet 1 12/18/2021   Accu-Chek Softclix Lancets lancets Check blood sugar four times a day. 100 each 12    Blood Glucose Monitoring Suppl (ACCU-CHEK GUIDE) w/Device KIT 1 kit by Does not apply route 4 (four) times daily. 1 kit 0    Blood Pressure Monitor MISC For regular home bp monitoring during pregnancy (Patient not taking: Reported on 12/03/2021) 1 each 0    glucose blood (ACCU-CHEK GUIDE) test strip Check blood sugar four times daily. 69 each 12     I have reviewed patient's Past Medical Hx, Surgical Hx, Family Hx, Social Hx, medications and allergies.   ROS:  Review of Systems  Eyes:  Negative for blurred vision.  Cardiovascular:  Negative for chest pain.  Neurological:  Positive for headaches.  Other systems negative  Physical Exam  Patient Vitals for the past 24  hrs:  BP Temp Temp src Pulse Resp SpO2 Height Weight  12/18/21 2141 (!) 147/87 98.5 F (36.9 C) Oral 81 18 99 % -- --  12/18/21 2116 (!) 141/86 -- -- -- -- -- -- --  12/18/21 2114 -- 98 F (36.7 C) -- 84 18 97 % _0  (1.575 m) 80.3 kg   Constitutional: Well-developed, well-nourished female in no acute distress.  Cardiovascular: normal rate and rhythm Respiratory: normal effort, clear to auscultation bilaterally GI: Abd soft, non-tender, gravid appropriate for gestational age.   No rebound or guarding. MS: Extremities nontender, no edema, normal ROM Neurologic: Alert and oriented x 4. DTRs 2+ with no clonus GU: Neg CVAT.  PELVIC EXAM: deferred Cervix was 2/50 yesterday   FHT:  Baseline 140 , moderate variability, accelerations present, no decelerations Contractions:  Irregular     Labs:  A/Positive/-- (06/22 1505)  Imaging:    MAU Course/MDM: I have ordered labs and EFM NST reviewed, reactive Consult Dr Rip Harbour with presentation, exam findings and test results.  Treatments in MAU included EFM.    Assessment: Single IUP at 24w3dNew Gestational Hypertension, vs Preeclampsia Mild headache, ? Severe feature Favorable cervix  Plan: Admit to Labor and Delivery Routine orders Induction of labor  MHansel FeinsteinCNM, MSN Certified Nurse-Midwife 12/18/2021 9:45 PM

## 2021-12-19 ENCOUNTER — Inpatient Hospital Stay (HOSPITAL_COMMUNITY): Payer: Medicaid Other | Admitting: Anesthesiology

## 2021-12-19 ENCOUNTER — Encounter (HOSPITAL_COMMUNITY): Payer: Self-pay | Admitting: *Deleted

## 2021-12-19 DIAGNOSIS — O134 Gestational [pregnancy-induced] hypertension without significant proteinuria, complicating childbirth: Secondary | ICD-10-CM | POA: Diagnosis not present

## 2021-12-19 DIAGNOSIS — Z3A38 38 weeks gestation of pregnancy: Secondary | ICD-10-CM | POA: Diagnosis not present

## 2021-12-19 DIAGNOSIS — O24429 Gestational diabetes mellitus in childbirth, unspecified control: Secondary | ICD-10-CM | POA: Diagnosis not present

## 2021-12-19 DIAGNOSIS — O24420 Gestational diabetes mellitus in childbirth, diet controlled: Secondary | ICD-10-CM | POA: Diagnosis not present

## 2021-12-19 LAB — CBC
HCT: 39.6 % (ref 36.0–46.0)
Hematocrit: 40.1 % (ref 34.0–46.6)
Hemoglobin: 13.3 g/dL (ref 11.1–15.9)
Hemoglobin: 13.2 g/dL (ref 12.0–15.0)
MCH: 28.3 pg (ref 26.6–33.0)
MCH: 29.1 pg (ref 26.0–34.0)
MCHC: 33.2 g/dL (ref 31.5–35.7)
MCHC: 33.3 g/dL (ref 30.0–36.0)
MCV: 85 fL (ref 79–97)
MCV: 87.4 fL (ref 80.0–100.0)
Platelets: 236 10*3/uL (ref 150–450)
Platelets: 215 10*3/uL (ref 150–400)
RBC: 4.7 x10E6/uL (ref 3.77–5.28)
RBC: 4.53 MIL/uL (ref 3.87–5.11)
RDW: 13 % (ref 11.7–15.4)
RDW: 13.4 % (ref 11.5–15.5)
WBC: 10.4 10*3/uL (ref 3.4–10.8)
WBC: 15.4 10*3/uL — ABNORMAL HIGH (ref 4.0–10.5)
nRBC: 0 % (ref 0.0–0.2)

## 2021-12-19 LAB — COMPREHENSIVE METABOLIC PANEL
ALT: 11 IU/L (ref 0–32)
ALT: 13 U/L (ref 0–44)
AST: 18 IU/L (ref 0–40)
AST: 19 U/L (ref 15–41)
Albumin/Globulin Ratio: 1.4 (ref 1.2–2.2)
Albumin: 3.8 g/dL (ref 3.8–4.8)
Albumin: 3 g/dL — ABNORMAL LOW (ref 3.5–5.0)
Alkaline Phosphatase: 230 IU/L — ABNORMAL HIGH (ref 44–121)
Alkaline Phosphatase: 180 U/L — ABNORMAL HIGH (ref 38–126)
Anion gap: 8 (ref 5–15)
BUN/Creatinine Ratio: 12 (ref 9–23)
BUN: 8 mg/dL (ref 6–20)
BUN: 7 mg/dL (ref 6–20)
Bilirubin Total: 0.2 mg/dL (ref 0.0–1.2)
CO2: 14 mmol/L — ABNORMAL LOW (ref 20–29)
CO2: 22 mmol/L (ref 22–32)
Calcium: 9.4 mg/dL (ref 8.7–10.2)
Calcium: 9.2 mg/dL (ref 8.9–10.3)
Chloride: 102 mmol/L (ref 96–106)
Chloride: 104 mmol/L (ref 98–111)
Creatinine, Ser: 0.65 mg/dL (ref 0.57–1.00)
Creatinine, Ser: 0.74 mg/dL (ref 0.44–1.00)
GFR, Estimated: >60 mL/min (ref >60)
Globulin, Total: 2.8 g/dL (ref 1.5–4.5)
Glucose, Bld: 107 mg/dL — ABNORMAL HIGH (ref 70–99)
Glucose: 131 mg/dL — ABNORMAL HIGH (ref 70–99)
Potassium: 3.9 mmol/L (ref 3.5–5.2)
Potassium: 4.2 mmol/L (ref 3.5–5.1)
Sodium: 135 mmol/L (ref 134–144)
Sodium: 134 mmol/L — ABNORMAL LOW (ref 135–145)
Total Bilirubin: 0.6 mg/dL (ref 0.3–1.2)
Total Protein: 6.6 g/dL (ref 6.0–8.5)
Total Protein: 6.6 g/dL (ref 6.5–8.1)
eGFR: 120 mL/min/{1.73_m2} (ref >59)

## 2021-12-19 LAB — PROTEIN / CREATININE RATIO, URINE
Creatinine, Urine: 75.7 mg/dL
Protein, Ur: 14.1 mg/dL
Protein/Creat Ratio: 186 mg/g creat (ref 0–200)

## 2021-12-19 LAB — RPR: RPR Ser Ql: NONREACTIVE

## 2021-12-19 MED ORDER — ONDANSETRON HCL 4 MG/2ML IJ SOLN
4.0000 mg | INTRAMUSCULAR | Status: DC | PRN
  Administered 2021-12-19: 21:00:00 4 mg via INTRAVENOUS
  Filled 2021-12-19: qty 2

## 2021-12-19 MED ORDER — OXYCODONE HCL 5 MG PO TABS: 5.0000 mg | ORAL_TABLET | ORAL | Status: DC | PRN

## 2021-12-19 MED ORDER — FUROSEMIDE 20 MG PO TABS: 20.0000 mg | ORAL_TABLET | Freq: Every day | ORAL | Status: DC

## 2021-12-19 MED ORDER — FENTANYL CITRATE (PF) 100 MCG/2ML IJ SOLN
50.0000 ug | INTRAMUSCULAR | Status: DC | PRN
  Administered 2021-12-19 (×4): 50 ug via INTRAVENOUS
  Filled 2021-12-19 (×5): qty 2

## 2021-12-19 MED ORDER — TERBUTALINE SULFATE 1 MG/ML IJ SOLN: 0.2500 mg | Freq: Once | INTRAMUSCULAR | Status: DC | PRN

## 2021-12-19 MED ORDER — COCONUT OIL OIL: 1.0000 "application " | TOPICAL_OIL | Status: DC | PRN

## 2021-12-19 MED ORDER — DIBUCAINE (PERIANAL) 1 % EX OINT: 1.0000 "application " | TOPICAL_OINTMENT | CUTANEOUS | Status: DC | PRN

## 2021-12-19 MED ORDER — LACTATED RINGERS IV SOLN
500.0000 mL | Freq: Once | INTRAVENOUS | Status: AC
  Administered 2021-12-19: 16:00:00 500 mL via INTRAVENOUS

## 2021-12-19 MED ORDER — FENTANYL-BUPIVACAINE-NACL 0.5-0.125-0.9 MG/250ML-% EP SOLN
12.0000 mL/h | EPIDURAL | Status: DC | PRN
  Administered 2021-12-19: 09:00:00 12 mL/h via EPIDURAL
  Filled 2021-12-19: qty 250

## 2021-12-19 MED ORDER — LIDOCAINE-EPINEPHRINE (PF) 2 %-1:200000 IJ SOLN
INTRAMUSCULAR | Status: DC | PRN
  Administered 2021-12-19: 5 mL via EPIDURAL

## 2021-12-19 MED ORDER — BENZOCAINE-MENTHOL 20-0.5 % EX AERO: 1.0000 "application " | INHALATION_SPRAY | CUTANEOUS | Status: DC | PRN

## 2021-12-19 MED ORDER — SIMETHICONE 80 MG PO CHEW: 80.0000 mg | CHEWABLE_TABLET | ORAL | Status: DC | PRN

## 2021-12-19 MED ORDER — PHENYLEPHRINE 40 MCG/ML (10ML) SYRINGE FOR IV PUSH (FOR BLOOD PRESSURE SUPPORT): 80.0000 ug | PREFILLED_SYRINGE | INTRAVENOUS | Status: DC | PRN

## 2021-12-19 MED ORDER — OXYTOCIN-SODIUM CHLORIDE 30-0.9 UT/500ML-% IV SOLN
1.0000 m[IU]/min | INTRAVENOUS | Status: DC
  Administered 2021-12-19: 04:00:00 2 m[IU]/min via INTRAVENOUS

## 2021-12-19 MED ORDER — ONDANSETRON HCL 4 MG PO TABS: 4.0000 mg | ORAL_TABLET | ORAL | Status: DC | PRN

## 2021-12-19 MED ORDER — FAMOTIDINE 20 MG PO TABS
20.0000 mg | ORAL_TABLET | Freq: Once | ORAL | Status: AC
  Administered 2021-12-19: 23:00:00 20 mg via ORAL
  Filled 2021-12-19: qty 1

## 2021-12-19 MED ORDER — WITCH HAZEL-GLYCERIN EX PADS: 1.0000 "application " | MEDICATED_PAD | CUTANEOUS | Status: DC | PRN

## 2021-12-19 MED ORDER — DIPHENHYDRAMINE HCL 50 MG/ML IJ SOLN: 12.5000 mg | INTRAMUSCULAR | Status: DC | PRN

## 2021-12-19 MED ORDER — IBUPROFEN 600 MG PO TABS
600.0000 mg | ORAL_TABLET | Freq: Four times a day (QID) | ORAL | Status: DC
  Administered 2021-12-19 – 2021-12-21 (×7): 600 mg via ORAL
  Filled 2021-12-19 (×7): qty 1

## 2021-12-19 MED ORDER — DIPHENHYDRAMINE HCL 25 MG PO CAPS: 25.0000 mg | ORAL_CAPSULE | Freq: Four times a day (QID) | ORAL | Status: DC | PRN

## 2021-12-19 MED ORDER — EPHEDRINE 5 MG/ML INJ: 10.0000 mg | INTRAVENOUS | Status: DC | PRN

## 2021-12-19 MED ORDER — ACETAMINOPHEN 325 MG PO TABS
650.0000 mg | ORAL_TABLET | ORAL | Status: DC | PRN
  Administered 2021-12-20: 02:00:00 650 mg via ORAL
  Filled 2021-12-19: qty 2

## 2021-12-19 MED ORDER — SENNOSIDES-DOCUSATE SODIUM 8.6-50 MG PO TABS
2.0000 | ORAL_TABLET | Freq: Every day | ORAL | Status: DC
  Administered 2021-12-20: 11:00:00 2 via ORAL
  Filled 2021-12-19: qty 2

## 2021-12-19 MED ORDER — ZOLPIDEM TARTRATE 5 MG PO TABS: 5.0000 mg | ORAL_TABLET | Freq: Every evening | ORAL | Status: DC | PRN

## 2021-12-19 MED ORDER — PRENATAL MULTIVITAMIN CH
1.0000 | ORAL_TABLET | Freq: Every day | ORAL | Status: DC
  Administered 2021-12-20 – 2021-12-21 (×2): 1 via ORAL
  Filled 2021-12-19 (×2): qty 1

## 2021-12-19 MED ORDER — FUROSEMIDE 20 MG PO TABS
20.0000 mg | ORAL_TABLET | Freq: Every day | ORAL | Status: DC
  Administered 2021-12-20 – 2021-12-21 (×2): 20 mg via ORAL
  Filled 2021-12-19 (×2): qty 1

## 2021-12-19 MED ORDER — OXYCODONE HCL 5 MG PO TABS
10.0000 mg | ORAL_TABLET | ORAL | Status: DC | PRN
  Administered 2021-12-20: 08:00:00 10 mg via ORAL
  Filled 2021-12-19: qty 2

## 2021-12-19 NOTE — Anesthesia Preprocedure Evaluation (Signed)
Anesthesia Evaluation  Patient identified by MRN, date of birth, ID band Patient awake    Reviewed: Allergy & Precautions, NPO status , Patient's Chart, lab work & pertinent test results  Airway Mallampati: II  TM Distance: >3 FB Neck ROM: Full    Dental no notable dental hx.    Pulmonary neg pulmonary ROS, former smoker,    Pulmonary exam normal breath sounds clear to auscultation       Cardiovascular hypertension (gHTN), Normal cardiovascular exam Rhythm:Regular Rate:Normal     Neuro/Psych negative neurological ROS  negative psych ROS   GI/Hepatic negative GI ROS, Neg liver ROS,   Endo/Other  diabetes, Gestational  Renal/GU negative Renal ROS  negative genitourinary   Musculoskeletal negative musculoskeletal ROS (+)   Abdominal   Peds  Hematology negative hematology ROS (+)   Anesthesia Other Findings IOL for gHTN  Reproductive/Obstetrics (+) Pregnancy                             Anesthesia Physical Anesthesia Plan  ASA: 3  Anesthesia Plan: Epidural   Post-op Pain Management:    Induction:   PONV Risk Score and Plan: Treatment may vary due to age or medical condition  Airway Management Planned: Natural Airway  Additional Equipment:   Intra-op Plan:   Post-operative Plan:   Informed Consent: I have reviewed the patients History and Physical, chart, labs and discussed the procedure including the risks, benefits and alternatives for the proposed anesthesia with the patient or authorized representative who has indicated his/her understanding and acceptance.       Plan Discussed with: Anesthesiologist  Anesthesia Plan Comments: (Patient identified. Risks, benefits, options discussed with patient including but not limited to bleeding, infection, nerve damage, paralysis, failed block, incomplete pain control, headache, blood pressure changes, nausea, vomiting, reactions to  medication, itching, and post partum back pain. Confirmed with bedside nurse the patient's most recent platelet count. Confirmed with the patient that they are not taking any anticoagulation, have any bleeding history or any family history of bleeding disorders. Patient expressed understanding and wishes to proceed. All questions were answered. )        Anesthesia Quick Evaluation

## 2021-12-19 NOTE — Anesthesia Procedure Notes (Signed)
Epidural Patient location during procedure: OB Start time: 12/19/2021 8:35 AM End time: 12/19/2021 8:45 AM  Staffing Anesthesiologist: Elmer Picker, MD Performed: anesthesiologist   Preanesthetic Checklist Completed: patient identified, IV checked, risks and benefits discussed, monitors and equipment checked, pre-op evaluation and timeout performed  Epidural Patient position: sitting Prep: DuraPrep and site prepped and draped Patient monitoring: continuous pulse ox, blood pressure, heart rate and cardiac monitor Approach: midline Location: L3-L4 Injection technique: LOR air  Needle:  Needle type: Tuohy  Needle gauge: 17 G Needle length: 9 cm Needle insertion depth: 6 cm Catheter type: closed end flexible Catheter size: 19 Gauge Catheter at skin depth: 11 cm Test dose: negative  Assessment Sensory level: T8 Events: blood not aspirated, injection not painful, no injection resistance, no paresthesia and negative IV test  Additional Notes Patient identified. Risks/Benefits/Options discussed with patient including but not limited to bleeding, infection, nerve damage, paralysis, failed block, incomplete pain control, headache, blood pressure changes, nausea, vomiting, reactions to medication both or allergic, itching and postpartum back pain. Confirmed with bedside nurse the patient's most recent platelet count. Confirmed with patient that they are not currently taking any anticoagulation, have any bleeding history or any family history of bleeding disorders. Patient expressed understanding and wished to proceed. All questions were answered. Sterile technique was used throughout the entire procedure. Please see nursing notes for vital signs. Test dose was given through epidural catheter and negative prior to continuing to dose epidural or start infusion. Warning signs of high block given to the patient including shortness of breath, tingling/numbness in hands, complete motor block,  or any concerning symptoms with instructions to call for help. Patient was given instructions on fall risk and not to get out of bed. All questions and concerns addressed with instructions to call with any issues or inadequate analgesia.  Reason for block:procedure for pain

## 2021-12-19 NOTE — Discharge Summary (Addendum)
Postpartum Discharge Summary       Patient Name: Emily [name] DOB: April 15, 1991 MRN: [id number]  Date of admission: 12/18/2021 Delivery date:12/19/2021  Delivering provider: Gaylan Gerold R  Date of discharge: 12/20/2021  Admitting diagnosis: Supervision of other normal pregnancy, antepartum [Z34.80] Intrauterine pregnancy: [redacted]w[redacted]d    Secondary diagnosis:  Principal Problem:   Supervision of other normal pregnancy, antepartum Active Problems:   Supervision of high risk pregnancy, antepartum   Abnormal chromosomal and genetic finding on antenatal screening mother   Gestational diabetes mellitus, class A1  Additional problems: GHTN    Discharge diagnosis: Term Pregnancy Delivered                                              Postpartum procedures: none (wants to do BC at pp visit) Augmentation: AROM, Pitocin, Cytotec, and IP Foley Complications: None  Hospital course: Induction of Labor With Vaginal Delivery   30y.o. yo [id number] 378w4das admitted to the hospital 12/18/2021 for induction of labor.  Indication for induction: Gestational hypertension and A1 DM.  Patient had an uncomplicated labor course as follows: Membrane Rupture Time/Date: 4:04 AM ,12/19/2021   Delivery Method:Vaginal, Spontaneous  Episiotomy: None  Lacerations:  None  Details of delivery can be found in separate delivery note.  Patient had a routine postpartum course. Patient is discharged home 12/20/21.  Newborn Data: Birth date:12/19/2021  Birth time:5:14 PM  Gender:Female  Living status:Living  Apgars:6 ,9  Weight:3020 g   Magnesium Sulfate received: No BMZ received: No Rhophylac:N/A MMR:N/A T-DaP:Given prenatally Flu: Given prenatally Transfusion:No  Physical exam  Vitals:   12/19/21 2000 12/19/21 2110 12/20/21 0119 12/20/21 0535  BP: 136/87 135/84 132/75 134/86  Pulse: 71 72 65 76  Resp: '18 18 18 18  ' Temp: 98.4 F (36.9 C) 98.3 F (36.8 C) 98.6 F (37 C) 98.5 F (36.9 C)   TempSrc: Oral Oral Oral Oral  SpO2: 98% 99% 98% 98%  Weight:      Height:       General: alert, cooperative, and no distress Lochia: appropriate Uterine Fundus: firm Incision: N/A DVT Evaluation: No evidence of DVT seen on physical exam. Negative Homan's sign. No cords or calf tenderness. No significant calf/ankle edema. Labs: Lab Results  Component Value Date   WBC 15.2 (H) 12/20/2021   HGB 12.1 12/20/2021   HCT 34.9 (L) 12/20/2021   MCV 85.3 12/20/2021   PLT 167 12/20/2021   CMP Latest Ref Rng & Units 12/19/2021  Glucose 70 - 99 mg/dL 107(H)  BUN 6 - 20 mg/dL 7  Creatinine 0.44 - 1.00 mg/dL 0.74  Sodium 135 - 145 mmol/L 134(L)  Potassium 3.5 - 5.1 mmol/L 4.2  Chloride 98 - 111 mmol/L 104  CO2 22 - 32 mmol/L 22  Calcium 8.9 - 10.3 mg/dL 9.2  Total Protein 6.5 - 8.1 g/dL 6.6  Total Bilirubin 0.3 - 1.2 mg/dL 0.6  Alkaline Phos 38 - 126 U/L 180(H)  AST 15 - 41 U/L 19  ALT 0 - 44 U/L 13   Edinburgh Score: No flowsheet data found.   After visit meds:  Allergies as of 12/20/2021   No Known Allergies      Medication List     STOP taking these medications    Accu-Chek Guide test strip Generic drug: glucose blood   Accu-Chek Guide w/Device  Kit   Accu-Chek Softclix Lancets lancets   acetaminophen 500 MG tablet Commonly known as: TYLENOL   aspirin 81 MG EC tablet   Blood Pressure Monitor Misc   Doxylamine-Pyridoxine 10-10 MG Tbec Commonly known as: Diclegis   ondansetron 4 MG disintegrating tablet Commonly known as: Zofran ODT   promethazine 25 MG tablet Commonly known as: PHENERGAN       TAKE these medications    furosemide 20 MG tablet Commonly known as: LASIX Take 1 tablet (20 mg total) by mouth daily.   ibuprofen 600 MG tablet Commonly known as: ADVIL Take 1 tablet (600 mg total) by mouth every 6 (six) hours.   NIFEdipine 30 MG 24 hr tablet Commonly known as: ADALAT CC Take 1 tablet (30 mg total) by mouth daily.   PRENATAL  VITAMIN PO Take by mouth.         Discharge home in stable condition Infant Feeding: Breast Infant Disposition:home with mother Discharge instruction: per After Visit Summary and Postpartum booklet. Activity: Advance as tolerated. Pelvic rest for 6 weeks.  Diet: routine diet Future Appointments:No future appointments. Follow up Visit:  Follow-up Information     [facility]. Schedule an appointment as soon as possible for a visit in 4 week(s).   Specialty: Obstetrics and Gynecology Why: for postpartum visit/nexplanon insertion/removal Contact information: [address] [place] [phone]                 Please schedule this patient for a In person postpartum visit in 6 weeks with the following provider: Any provider. Additional Postpartum F/U:2 hour GTT and BP check 1 week  High risk pregnancy complicated by: GDM and HTN Delivery mode:  Vaginal, Spontaneous  Anticipated Birth Control:  Nexplanon/vasectomy   12/20/2021 Christin Fudge, CNM

## 2021-12-19 NOTE — Progress Notes (Signed)
Patient ID: Emily [name], female   DOB: [birth year], 30 y.o.   MRN: [id number]  Labor Progress Note [name] is a 30 y.o. [phone] at [redacted]w[redacted]d presented for IOL for gHTN (normal labs) and A1GDM (all glucose in 100s since admission to hospital)  S:  Called to bedside by RN for repeated decels, baby will only tolerate pt in semi-fowlers. FOB at bedside, pt tolerated cervical check and placement of internal monitors well.  O:  BP 134/71    Pulse (!) 58    Temp 98.5 F (36.9 C) (Oral)    Resp 18    Ht 5\' 2"  (1.575 m)    Wt 177 lb (80.3 kg)    LMP  (LMP Unknown)    SpO2 98%    BMI 32.37 kg/m  EFM: baseline 125 bpm/ moderate variability/ 15x15 accels/ repeated deep variable decels  Toco/IUPC: runs of q33min, then a break of 3-43min SVE: Dilation: 5 Effacement (%): 80 Station: -1, 0 Presentation: Vertex Exam by:: [email or web address], CNM Pitocin: 8 mu/min, will decrease to 32mu/min if decels continue  A/P: 30 y.o. 34 [redacted]w[redacted]d  1. Labor: Latent to active 2. FWB: Cat 2, recovered to Cat 1 and overall reassuring after FSE placed  3. Pain: well-controlled with epidural 4. gHTN: stable BP, no s/sx of PEC   Continue to titrate pitocin as tolerated, will halve if baby continues to have decels. Cautiously anticipate SVD.  [redacted]w[redacted]d, CNM 12:56 PM

## 2021-12-19 NOTE — Progress Notes (Signed)
Emily [name] is a 30 y.o. [id number] at [redacted]w[redacted]d admitted for induction of labor due to gHTN.  Subjective: Feels better after IV Fentanyl  Objective: BP (!) 118/93    Pulse 66    Temp 98.2 F (36.8 C) (Oral)    Resp 18    Ht 5\' 2"  (1.575 m)    Wt 80.3 kg    LMP  (LMP Unknown)    SpO2 98%    BMI 32.37 kg/m  No intake/output data recorded. No intake/output data recorded.  FHT:  FHR: 130 bpm, variability: moderate,  accelerations:  Present,  decelerations:  Absent UC:   regular, every 1-3 minutes SVE:   Dilation: 4.5 Effacement (%): 50 Station: -2 Exam by:: [email or web address], RN  Labs: Lab Results  Component Value Date   WBC 11.4 (H) 12/18/2021   HGB 13.1 12/18/2021   HCT 38.9 12/18/2021   MCV 86.4 12/18/2021   PLT 223 12/18/2021    Assessment / Plan: 12/20/2021 at [redacted]w[redacted]d admitted for induction of labor due to gHTN.  Labor:  Now s/p cytotec x1, Foley balloon out. Cervix 4.5cm, head well applied. AROMed during this check. Will start pit 2x2   Fetal Wellbeing:  Category I Pain Control:  IV pain meds I/D:   GBS neg  #gHTN BP normotensive. P: C normal. Plt normal. CMP not resulted. RN to follow up  [redacted]w[redacted]d 12/19/2021, 4:16 AM

## 2021-12-20 ENCOUNTER — Other Ambulatory Visit (HOSPITAL_COMMUNITY): Payer: Self-pay

## 2021-12-20 LAB — CBC
HCT: 34.9 % — ABNORMAL LOW (ref 36.0–46.0)
Hemoglobin: 12.1 g/dL (ref 12.0–15.0)
MCH: 29.6 pg (ref 26.0–34.0)
MCHC: 34.7 g/dL (ref 30.0–36.0)
MCV: 85.3 fL (ref 80.0–100.0)
Platelets: 167 10*3/uL (ref 150–400)
RBC: 4.09 MIL/uL (ref 3.87–5.11)
RDW: 13.2 % (ref 11.5–15.5)
WBC: 15.2 10*3/uL — ABNORMAL HIGH (ref 4.0–10.5)
nRBC: 0 % (ref 0.0–0.2)

## 2021-12-20 MED ORDER — NIFEDIPINE ER OSMOTIC RELEASE 30 MG PO TB24
30.0000 mg | ORAL_TABLET | Freq: Two times a day (BID) | ORAL | Status: DC
Start: 2021-12-20 — End: 2021-12-20

## 2021-12-20 MED ORDER — NIFEDIPINE ER 30 MG PO TB24
30.0000 mg | ORAL_TABLET | Freq: Every day | ORAL | 0 refills | Status: DC
  Filled 2021-12-20: qty 90, 90d supply, fill #0

## 2021-12-20 MED ORDER — IBUPROFEN 600 MG PO TABS
600.0000 mg | ORAL_TABLET | Freq: Four times a day (QID) | ORAL | 0 refills | Status: DC
  Filled 2021-12-20: qty 30, 8d supply, fill #0

## 2021-12-20 MED ORDER — FUROSEMIDE 20 MG PO TABS
20.0000 mg | ORAL_TABLET | Freq: Every day | ORAL | 0 refills | Status: DC
  Filled 2021-12-20: qty 4, 4d supply, fill #0

## 2021-12-20 MED ORDER — NIFEDIPINE ER OSMOTIC RELEASE 30 MG PO TB24
30.0000 mg | ORAL_TABLET | Freq: Every day | ORAL | Status: DC
  Administered 2021-12-20 – 2021-12-21 (×2): 30 mg via ORAL
  Filled 2021-12-20 (×2): qty 1

## 2021-12-20 NOTE — Anesthesia Postprocedure Evaluation (Signed)
Anesthesia Post Note  Patient: Emily [name]  Procedure(s) Performed: AN AD HOC LABOR EPIDURAL     Patient location during evaluation: Mother Baby Anesthesia Type: Epidural Level of consciousness: awake and alert Pain management: pain level controlled Vital Signs Assessment: post-procedure vital signs reviewed and stable Respiratory status: spontaneous breathing, nonlabored ventilation and respiratory function stable Cardiovascular status: stable Postop Assessment: no headache, no backache, epidural receding, no apparent nausea or vomiting, patient able to bend at knees, adequate PO intake and able to ambulate Anesthetic complications: no   No notable events documented.  Last Vitals:  Vitals:   12/20/21 0119 12/20/21 0535  BP: 132/75 134/86  Pulse: 65 76  Resp: 18 18  Temp: 37 C 36.9 C  SpO2: 98% 98%    Last Pain:  Vitals:   12/20/21 0737  TempSrc:   PainSc: 8    Pain Goal: Patients Stated Pain Goal: 2 (12/19/21 2000)                 Laban Emperor

## 2021-12-20 NOTE — Progress Notes (Signed)
CSW received a telephone call from CPS worker, Salley Hews.  CPS communicated that CPS will be visiting with MOB today at [facility] to initiate CPS report.  CPS requested that MOB discharge after CPS meets with MOB; CSW agreed and updated RN.   Blaine Hamper, MSW, LCSW Clinical Social Work [phone]

## 2021-12-20 NOTE — Clinical Social Work Maternal (Signed)
CLINICAL SOCIAL WORK MATERNAL/CHILD NOTE  Patient Details  Name: Emily [name] MRN: [id number] Date of Birth: 04/04/1991  Date:  2021-05-03  Clinical Social Worker Initiating Note:  Laurey Arrow Date/Time: Initiated:  12/20/21/1210     Child's Name:  Darlyne Russian   Biological Parents:  Mother, Father   Need for Interpreter:  None   Reason for Referral:  Current Substance Use/Substance Use During Pregnancy   (infant UDS (+) for [facility].)   Address:  [place], [place] [address]    Phone number:  [phone] (home)     Additional phone number: FOB's number is [phone]  Household Members/Support Persons (HM/SP):   Household Member/Support Person 1, Household Member/Support Person 2   HM/SP Name Relationship DOB or Age  HM/SP -1 Emmamarie Kluender FOB 04/15/1976  HM/SP -2 Doristine Johns son 01/24/2007  HM/SP -3        HM/SP -4        HM/SP -5        HM/SP -6        HM/SP -7        HM/SP -8          Natural Supports (not living in the home):  Extended Family, Immediate Family, Parent (Per MOB, FOB's family will also provide supports if needed.)   Professional Supports: None   Employment: Unemployed   Type of Work:     Education:  9 to 11 years   Homebound arranged: No (MOB reported she completed the 10th grade.)  Financial Resources:  Medicaid   Other Resources:  Physicist, medical  , [facility]   Cultural/Religious Considerations Which May Impact Care:  None reported  Strengths:  Ability to meet basic needs  , Pediatrician chosen   Psychotropic Medications:         Pediatrician:    [place]  Pediatrician List:   [place]      Pediatrician Fax Number:    Risk Factors/Current Problems:  Substance Use     Cognitive State:  Able to Concentrate  , Insightful  , Linear Thinking     Mood/Affect:  Calm  , Interested  , Happy      CSW Assessment: CSW met with MOB to complete an assessment for a consult for hx of THC use in pregnancy.  When CSW arrived, MOB was resting in bed, infant was asleep in bassinet, and FOB was resting on the couch; everyone appeared happy and comfortable. CSW explained CSW's role and with MOB's permission, CSW asked FOB to leave the room; FOB left without incident. MOB was polite and was receptive with meeting with CSW. 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 MOB reported last use of a marijuana was about "4 days ago."   CSW informed MOB of the hospital's drug screen policy. MOB was made aware of the 2 drug screenings for the infant.  MOB was understanding and did not have any concerns.  CSW shared with MOB that the infant's UDS is positive and CSW will make a report to [place] (report made to CPS worker Sheral Apley). MOB is aware that CSW will continue to monitor the infant's CDS and will update [facility] CPS. CSW offered MOB resources and referrals for substance interventions and MOB declined. MOB also  denied having any CPS hx. MOB did not have any questions about the hospital's policy and reported having all essential items for infant.  There are no barriers to infant's discharge.  CPS will follow-up with family post discharge.   CSW Plan/Description:  No Further Intervention Required/No Barriers to Discharge, Sudden Infant Death Syndrome (SIDS) Education, Perinatal Mood and Anxiety Disorder (PMADs) Education, CSW Will Continue to Monitor Umbilical Cord Tissue Drug Screen Results and Make Report if Warranted, Child Protective Service Report  , Other Information/Referral to [place], MSW, LCSW Clinical Social Work [phone]  Dimple Nanas, LCSW 12/20/2021, 12:17 PM

## 2021-12-20 NOTE — Progress Notes (Signed)
Per CPS there are no barriers to infant's discharge to MOB when infant is medically ready.  ° °RN updated.  ° °Ashantae Pangallo Boyd-Gilyard, MSW, LCSW °Clinical Social Work °[phone] ° °

## 2021-12-21 MED ORDER — CALCIUM CARBONATE ANTACID 500 MG PO CHEW
400.0000 mg | CHEWABLE_TABLET | Freq: Two times a day (BID) | ORAL | Status: DC | PRN
  Administered 2021-12-21: 07:00:00 400 mg via ORAL
  Filled 2021-12-21: qty 2

## 2021-12-21 NOTE — Discharge Summary (Signed)
Postpartum Discharge Summary       Patient Name: Emily [name] DOB: [birth year] MRN: [id number]  Date of admission: 12/18/2021 Delivery date:12/19/2021  Delivering provider: Gaylan Gerold R  Date of discharge: 12/21/2021  Admitting diagnosis: Supervision of other normal pregnancy, antepartum [Z34.80] Intrauterine pregnancy: [redacted]w[redacted]d    Secondary diagnosis:  Principal Problem:   Supervision of other normal pregnancy, antepartum Active Problems:   Supervision of high risk pregnancy, antepartum   Abnormal chromosomal and genetic finding on antenatal screening mother   Gestational diabetes mellitus, class A1  Additional problems: GHTN    Discharge diagnosis: Term Pregnancy Delivered                                              Postpartum procedures: none (wants to do BC at pp visit) Augmentation: AROM, Pitocin, Cytotec, and IP Foley Complications: None  Hospital course: Induction of Labor With Vaginal Delivery   30y.o. yo [id number] 316w4das admitted to the hospital 12/18/2021 for induction of labor.  Indication for induction: Gestational hypertension and A1 DM.  Patient had an uncomplicated labor course as follows: Membrane Rupture Time/Date: 4:04 AM ,12/19/2021   Delivery Method:Vaginal, Spontaneous  Episiotomy: None  Lacerations:  None  Details of delivery can be found in separate delivery note.  Patient had a routine postpartum course. Patient is discharged home 12/21/21. Her DC yesterday was cancelled d/t pediatrician not wanting to DC baby early.   Newborn Data: Birth date:12/19/2021  Birth time:5:14 PM  Gender:Female  Living status:Living  Apgars:6 ,9  Weight:3020 g   Magnesium Sulfate received: No BMZ received: No Rhophylac:N/A MMR:N/A T-DaP:Given prenatally Flu: Given prenatally Transfusion:No  Physical exam  Vitals:   12/20/21 0800 12/20/21 1415 12/20/21 2011 12/21/21 0500  BP: 127/81 124/77 128/80 130/79  Pulse: 75 60 (!) 57 66  Resp: '18 18 16  15  ' Temp: 97.9 F (36.6 C) 97.8 F (36.6 C) 98.1 F (36.7 C) 97.9 F (36.6 C)  TempSrc: Oral Oral Oral Oral  SpO2: 99%  98% 99%  Weight:      Height:       General: alert, cooperative, and no distress Lochia: appropriate Uterine Fundus: firm Incision: N/A DVT Evaluation: No evidence of DVT seen on physical exam. Negative Homan's sign. No cords or calf tenderness. No significant calf/ankle edema. Labs: Lab Results  Component Value Date   WBC 15.2 (H) 12/20/2021   HGB 12.1 12/20/2021   HCT 34.9 (L) 12/20/2021   MCV 85.3 12/20/2021   PLT 167 12/20/2021   CMP Latest Ref Rng & Units 12/19/2021  Glucose 70 - 99 mg/dL 107(H)  BUN 6 - 20 mg/dL 7  Creatinine 0.44 - 1.00 mg/dL 0.74  Sodium 135 - 145 mmol/L 134(L)  Potassium 3.5 - 5.1 mmol/L 4.2  Chloride 98 - 111 mmol/L 104  CO2 22 - 32 mmol/L 22  Calcium 8.9 - 10.3 mg/dL 9.2  Total Protein 6.5 - 8.1 g/dL 6.6  Total Bilirubin 0.3 - 1.2 mg/dL 0.6  Alkaline Phos 38 - 126 U/L 180(H)  AST 15 - 41 U/L 19  ALT 0 - 44 U/L 13   Edinburgh Score: Edinburgh Postnatal Depression Scale Screening Tool 12/20/2021  I have been able to laugh and see the funny side of things. 0  I have looked forward with enjoyment to things. 0  I have  blamed myself unnecessarily when things went wrong. 2  I have been anxious or worried for no good reason. 2  I have felt scared or panicky for no good reason. 0  Things have been getting on top of me. 0  I have been so unhappy that I have had difficulty sleeping. 0  I have felt sad or miserable. 0  I have been so unhappy that I have been crying. 0  The thought of harming myself has occurred to me. 0  Edinburgh Postnatal Depression Scale Total 4     After visit meds:  Allergies as of 12/21/2021   No Known Allergies      Medication List     STOP taking these medications    Accu-Chek Guide test strip Generic drug: glucose blood   Accu-Chek Guide w/Device Kit   Accu-Chek Softclix Lancets  lancets   acetaminophen 500 MG tablet Commonly known as: TYLENOL   aspirin 81 MG EC tablet   Blood Pressure Monitor Misc   Doxylamine-Pyridoxine 10-10 MG Tbec Commonly known as: Diclegis   ondansetron 4 MG disintegrating tablet Commonly known as: Zofran ODT   promethazine 25 MG tablet Commonly known as: PHENERGAN       TAKE these medications    furosemide 20 MG tablet Commonly known as: LASIX Take 1 tablet (20 mg total) by mouth daily.   ibuprofen 600 MG tablet Commonly known as: ADVIL Take 1 tablet (600 mg total) by mouth every 6 (six) hours.   NIFEdipine 30 MG 24 hr tablet Commonly known as: ADALAT CC Take 1 tablet (30 mg total) by mouth daily.   PRENATAL VITAMIN PO Take by mouth.         Discharge home in stable condition Infant Feeding: Breast Infant Disposition:home with mother Discharge instruction: per After Visit Summary and Postpartum booklet. Activity: Advance as tolerated. Pelvic rest for 6 weeks.  Diet: routine diet Future Appointments: Future Appointments  Date Time Provider [place]  12/27/2021  8:30 AM Janyth Pupa, DO [facility]  12/27/2021  8:50 AM [facility]  01/29/2022 10:10 AM Roma Schanz, CNM [facility]   Follow up Visit:  Follow-up Information     [facility]. Schedule an appointment as soon as possible for a visit in 4 week(s).   Specialty: Obstetrics and Gynecology Why: for postpartum visit/nexplanon insertion/removal Contact information: [address] [place] [phone]                 Please schedule this patient for a In person postpartum visit in 6 weeks with the following provider: Any provider. Additional Postpartum F/U:2 hour GTT and BP check 1 week  High risk pregnancy complicated by: GDM and HTN Delivery mode:  Vaginal, Spontaneous  Anticipated Birth Control:  Nexplanon/vasectomy   12/21/2021 Christin Fudge, CNM

## 2021-12-24 ENCOUNTER — Telehealth: Payer: Self-pay

## 2021-12-24 ENCOUNTER — Encounter: Payer: Medicaid Other | Admitting: Women's Health

## 2021-12-24 ENCOUNTER — Encounter: Payer: Self-pay | Admitting: *Deleted

## 2021-12-24 NOTE — Telephone Encounter (Signed)
Transition Care Management Unsuccessful Follow-up Telephone Call ° °Date of discharge and from where:  12/21/2021 from [facility] ° °Attempts:  1st Attempt ° °Reason for unsuccessful TCM follow-up call:  Left voice message ° ° ° °

## 2021-12-25 NOTE — Telephone Encounter (Signed)
Transition Care Management Unsuccessful Follow-up Telephone Call  Date of discharge and from where:  [facility]/21/[name]  Attempts:  2nd Attempt  Reason for unsuccessful TCM follow-up call:  Left voice message

## 2021-12-26 NOTE — Telephone Encounter (Signed)
Transition Care Management Unsuccessful Follow-up Telephone Call  Date of discharge and from where:  12/21/2021 from [facility]  Attempts:  3rd Attempt  Reason for unsuccessful TCM follow-up call:  Unable to reach patient

## 2021-12-27 ENCOUNTER — Encounter: Payer: Self-pay | Admitting: Medical

## 2021-12-27 ENCOUNTER — Other Ambulatory Visit: Payer: Medicaid Other

## 2021-12-27 ENCOUNTER — Ambulatory Visit (INDEPENDENT_AMBULATORY_CARE_PROVIDER_SITE_OTHER): Payer: Medicaid Other | Admitting: Medical

## 2021-12-27 ENCOUNTER — Other Ambulatory Visit: Payer: Self-pay

## 2021-12-27 VITALS — BP 129/83 | HR 76 | Ht 62.0 in | Wt 167.0 lb

## 2021-12-27 DIAGNOSIS — O133 Gestational [pregnancy-induced] hypertension without significant proteinuria, third trimester: Secondary | ICD-10-CM

## 2021-12-27 DIAGNOSIS — Z0130 Encounter for examination of blood pressure without abnormal findings: Secondary | ICD-10-CM

## 2021-12-27 MED ORDER — NIFEDIPINE ER OSMOTIC RELEASE 60 MG PO TB24: 60.0000 mg | ORAL_TABLET | Freq: Every day | ORAL | 2 refills | Status: DC

## 2021-12-27 NOTE — Progress Notes (Signed)
°  History:  [name] is a 30 y.o. [id number] who presents to clinic today for BP check. She is 1 week PP. She was started on 30 mg Procardia daily and Lasix at discharge from [facility]. She states headache started yesterday and has not relieved with Tylenol. She took BP at home yesterday with values of 150/94 and 148/92. Last labs were normal on 12/20/21.   The following portions of the patient's history were reviewed and updated as appropriate: allergies, current medications, family history, past medical history, social history, past surgical history and problem list.  Review of Systems:  Review of Systems  Constitutional:  Negative for chills and fever.  Eyes:  Negative for blurred vision and photophobia.  Cardiovascular:  Negative for chest pain and leg swelling.  Neurological:  Positive for headaches.     Objective:  Physical Exam BP 129/83 (BP Location: Right Arm, Patient Position: Sitting, Cuff Size: Normal)    Pulse 76    Ht 5\' 2"  (1.575 m)    Wt 167 lb (75.8 kg)    Breastfeeding No    BMI 30.54 kg/m  Physical Exam Vitals and nursing note reviewed.  Constitutional:      General: She is not in acute distress.    Appearance: She is well-developed.  Cardiovascular:     Rate and Rhythm: Normal rate.  Pulmonary:     Effort: Pulmonary effort is normal.  Abdominal:     General: There is no distension.     Palpations: Abdomen is soft.  Genitourinary:    General: Normal vulva.     Vagina: No vaginal discharge or bleeding.     Cervix: No cervical motion tenderness, discharge or friability.     Uterus: Not enlarged and not tender.      Adnexa:        Right: No mass or tenderness.         Left: No mass or tenderness.    Musculoskeletal:        General: No swelling.  Skin:    General: Skin is warm and dry.     Findings: No erythema.  Neurological:     Mental Status: She is alert and oriented to person, place, and time.  Psychiatric:        Mood and Affect: Mood normal.     Health Maintenance Due  Topic Date Due   COVID-19 Vaccine (1) Never done   Pneumococcal Vaccine 14-1 Years old (1 - PCV) Never done   URINE MICROALBUMIN  Never done     Assessment & Plan:  1. BP check - Normotensive in office today, however given continued headache and recent elevated BP at home, discussed with Dr. 77 and will increase Procardia to 60 mg daily and recheck BP on Tuesday in the office.  - Warning signs for worsening condition and reasons to go to MAU were discussed with the patient   Approximately 15 minutes of total time was spent with this patient on history taking, chart review, consulting with Attending, coordination of care and documentation.   Wednesday, PA-C 12/27/2021 10:03 AM

## 2021-12-29 DIAGNOSIS — Z419 Encounter for procedure for purposes other than remedying health state, unspecified: Secondary | ICD-10-CM | POA: Diagnosis not present

## 2022-01-01 ENCOUNTER — Telehealth (HOSPITAL_COMMUNITY): Payer: Self-pay | Admitting: *Deleted

## 2022-01-01 NOTE — Telephone Encounter (Signed)
Patient voiced no questions or concerns at this time. EPDS=7. Patient voiced no questions or concerns regarding infant at this time. Patient reports infant sleeps in a bassinet on her back. RN reviewed ABCs of safe sleep. Patient verbalized understanding. Patient informed about hospital's virtual postpartum classes and support groups - declined email information at this time. Deforest Hoyles, RN, 01/01/22, [phone]

## 2022-01-29 ENCOUNTER — Ambulatory Visit (INDEPENDENT_AMBULATORY_CARE_PROVIDER_SITE_OTHER): Payer: Medicaid Other | Admitting: Women's Health

## 2022-01-29 ENCOUNTER — Other Ambulatory Visit: Payer: Self-pay

## 2022-01-29 ENCOUNTER — Encounter: Payer: Self-pay | Admitting: Women's Health

## 2022-01-29 DIAGNOSIS — Z8632 Personal history of gestational diabetes: Secondary | ICD-10-CM | POA: Diagnosis not present

## 2022-01-29 DIAGNOSIS — Z3046 Encounter for surveillance of implantable subdermal contraceptive: Secondary | ICD-10-CM

## 2022-01-29 DIAGNOSIS — Z8759 Personal history of other complications of pregnancy, childbirth and the puerperium: Secondary | ICD-10-CM

## 2022-01-29 DIAGNOSIS — Z419 Encounter for procedure for purposes other than remedying health state, unspecified: Secondary | ICD-10-CM | POA: Diagnosis not present

## 2022-01-29 NOTE — Patient Instructions (Signed)
You will have your sugar test next visit.  Please do not eat or drink anything after midnight the night before you come, not even water.  You will be here for at least two hours.  Please make an appointment online for the bloodwork at [email or web address] for 8:30am (or as close to this as possible). Make sure you select the [address] service center. The day of the appointment, check in with our office first, then you will go to [facility] to start the sugar test.   

## 2022-01-29 NOTE — Progress Notes (Signed)
POSTPARTUM VISIT Patient name: Emily [name] MRN [id number]  Date of birth: Jan 26, 1991 Chief Complaint:   Postpartum Care  History of Present Illness:   Emily [name] is a 31 y.o. [phone] Caucasian female being seen today for a postpartum visit. She is 6 weeks postpartum following a spontaneous vaginal delivery at 38.4 gestational weeks. IOL: yes, for diabetes mellitus A1DM and gestational hypertension . Anesthesia: epidural.  Laceration: none.  Complications: none. Inpatient contraception: no.   Pregnancy complicated by [facility] and W4OX . Tobacco use: former . Substance use disorder: no. Last pap smear: 06/27/21 and results were NILM w/ HRHPV negative. Next pap smear due: 2025 No LMP recorded.  Postpartum course has been complicated by PPHTN . D/C'd home on lasix and procardia. Stopped couple weeks ago. Bleeding none. Bowel function is normal. Bladder function is normal. Urinary incontinence? no, fecal incontinence? no Patient is sexually active. Last sexual activity:  few days ago . Desired contraception:  partner had vasectomy few months ago . Pt has expired Nexplanon in that needs to be removed. Patient does not want a pregnancy in the future.  Desired family size is 2 children.   Upstream - 01/29/22 1019       Pregnancy Intention Screening   Does the patient want to become pregnant in the next year? N/A    Does the patient's partner want to become pregnant in the next year? N/A    Would the patient like to discuss contraceptive options today? N/A      Contraception Wrap Up   Current Method Vasectomy    End Method Vasectomy    Contraception Counseling Provided No            The pregnancy intention screening data noted above was reviewed. Potential methods of contraception were discussed. The patient elected to proceed with Vasectomy.  Edinburgh Postpartum Depression Screening: negative  Edinburgh Postnatal Depression Scale - 01/29/22 1019       Edinburgh Postnatal  Depression Scale:  In the Past 7 Days   I have been able to laugh and see the funny side of things. 0    I have looked forward with enjoyment to things. 0    I have blamed myself unnecessarily when things went wrong. 2    I have been anxious or worried for no good reason. 1    I have felt scared or panicky for no good reason. 0    Things have been getting on top of me. 2    I have been so unhappy that I have had difficulty sleeping. 0    I have felt sad or miserable. 1    I have been so unhappy that I have been crying. 1    The thought of harming myself has occurred to me. 0    Edinburgh Postnatal Depression Scale Total 7             GAD 7 : Generalized Anxiety Score 10/01/2021 06/27/2021 04/25/2021  Nervous, Anxious, on Edge 0 1 1  Control/stop worrying '1 1 1  ' Worry too much - different things 1 1 0  Trouble relaxing 1 1 0  Restless 0 0 0  Easily annoyed or irritable 0 0 1  Afraid - awful might happen '1 1 1  ' Total GAD 7 Score '4 5 4     ' Baby's course has been uncomplicated. Baby is feeding by bottle. Infant has a pediatrician/family doctor? Yes.  Childcare strategy if returning to work/school:  undecided .  Pt has material needs met for her and baby: Yes.   Review of Systems:   Pertinent items are noted in HPI Denies Abnormal vaginal discharge w/ itching/odor/irritation, headaches, visual changes, shortness of breath, chest pain, abdominal pain, severe nausea/vomiting, or problems with urination or bowel movements. Pertinent History Reviewed:  Reviewed past medical,surgical, obstetrical and family history.  Reviewed problem list, medications and allergies. OB History  Gravida Para Term Preterm AB Living  '4 2 2   2 2  ' SAB IAB Ectopic Multiple Live Births  2     0 2    # Outcome Date GA Lbr Len/2nd Weight Sex Delivery Anes PTL Lv  4 Term 12/19/21 [id number] / 01:23 6 lb 10.5 oz (3.02 kg) F Vag-Spont EPI  LIV     Birth Comments: wnl  3 SAB 02/2021          2 Term 01/24/07  [id number] lb 8 oz (2.948 kg) M Vag-Spont EPI N LIV  1 SAB 2007           Physical Assessment:   Vitals:   01/29/22 1018  BP: 115/74  Pulse: 71  Weight: 165 lb 9.6 oz (75.1 kg)  Height: '5\' 2"'  (1.575 m)  Body mass index is 30.29 kg/m.       Physical Examination:   General appearance: alert, well appearing, and in no distress  Mental status: alert, oriented to person, place, and time  Skin: warm & dry   Cardiovascular: normal heart rate noted   Respiratory: normal respiratory effort, no distress   Breasts: deferred, no complaints   Abdomen: soft, non-tender   Pelvic: examination not indicated. Thin prep pap obtained: No  Rectal: not examined  Extremities: Edema: none   Chaperone: N/A    NEXPLANON REMOVAL:  Time out was performed.  Nexplanon site identified.  Area prepped in usual sterile fashon. One cc of 2% lidocaine was used to anesthetize the area at the distal end of the implant. A small stab incision was made right beside the implant on the distal portion.  The Nexplanon rod was grasped using hemostats and removed without difficulty.  There was less than 3 cc blood loss. There were no complications.  Steri-strips were applied over the small incision and a pressure bandage was applied.  The patient tolerated the procedure well.       No results found for this or any previous visit (from the past 24 hour(s)).  Assessment & Plan:  1) Postpartum exam 2) 6 wks s/p spontaneous vaginal delivery after IOL for GHTN and A1DM 3) bottle feeding 4) Depression screening 5) Contraception partner s/p vasectomy 6) Nexplanon removal She was instructed to keep the area clean and dry, remove pressure bandage in 24 hours, and keep insertion site covered with the steri-strip for 3-5 days.   Follow-up PRN problems. 7) Resolved PPHTN 8) A1DM during pregnancy> 2hr GTT in 2-3wks  Essential components of care per ACOG recommendations:  1.  Mood and well being:  If positive depression screen,  discussed and plan developed.  If using tobacco we discussed reduction/cessation and risk of relapse If current substance abuse, we discussed and referral to local resources was offered.   2. Infant care and feeding:  If breastfeeding, discussed returning to work, pumping, breastfeeding-associated pain, guidance regarding return to fertility while lactating if not using another method. If needed, patient was provided with a letter to be allowed to pump q 2-3hrs to support lactation in a private location with  access to a refrigerator to store breastmilk.   Recommended that all caregivers be immunized for flu, pertussis and other preventable communicable diseases If pt does not have material needs met for her/baby, referred to local resources for help obtaining these.  3. Sexuality, contraception and birth spacing Provided guidance regarding sexuality, management of dyspareunia, and resumption of intercourse Discussed avoiding interpregnancy interval <95mhs and recommended birth spacing of 18 months  4. Sleep and fatigue Discussed coping options for fatigue and sleep disruption Encouraged family/partner/community support of 4 hrs of uninterrupted sleep to help with mood and fatigue  5. Physical recovery  If pt had a C/S, assessed incisional pain and providing guidance on normal vs prolonged recovery If pt had a laceration, perineal healing and pain reviewed.  If urinary or fecal incontinence, discussed management and referred to PT or uro/gyn if indicated  Patient is safe to resume physical activity. Discussed attainment of healthy weight.  6.  Chronic disease management Discussed pregnancy complications if any, and their implications for future childbearing and long-term maternal health. Review recommendations for prevention of recurrent pregnancy complications, such as 17 hydroxyprogesterone caproate to reduce risk for recurrent PTB not applicable, or aspirin to reduce risk of preeclampsia  not planning future pregnancy. Pt had GDM: yes. If yes, 2hr GTT scheduled: yes. Reviewed medications and non-pregnant dosing including consideration of whether pt is breastfeeding using a reliable resource such as LactMed: not applicable Referred for f/u w/ PCP or subspecialist providers as indicated: not applicable  7. Health maintenance Mammogram at 428yoor earlier if indicated Pap smears as indicated  Meds: No orders of the defined types were placed in this encounter.   Follow-up: Return in about 3 weeks (around 02/19/2022) for 2hr postpartum sugar test; then 18yror physical.   No orders of the defined types were placed in this encounter.   KiRoma SchanzNM, [facility]/12/2021 11:18 AM

## 2022-02-19 ENCOUNTER — Other Ambulatory Visit: Payer: Medicaid Other

## 2022-02-19 DIAGNOSIS — Z8632 Personal history of gestational diabetes: Secondary | ICD-10-CM | POA: Diagnosis not present

## 2022-02-20 DIAGNOSIS — Z0389 Encounter for observation for other suspected diseases and conditions ruled out: Secondary | ICD-10-CM | POA: Diagnosis not present

## 2022-02-20 DIAGNOSIS — F4320 Adjustment disorder, unspecified: Secondary | ICD-10-CM | POA: Diagnosis not present

## 2022-02-20 LAB — GLUCOSE TOLERANCE, 2 HOURS W/ 1HR
Glucose, 1 hour: 167 mg/dL (ref 70–179)
Glucose, 2 hour: 73 mg/dL (ref 70–152)
Glucose, Fasting: 89 mg/dL (ref 70–91)

## 2022-02-26 DIAGNOSIS — Z419 Encounter for procedure for purposes other than remedying health state, unspecified: Secondary | ICD-10-CM | POA: Diagnosis not present

## 2022-03-09 IMAGING — US US OB < 14 WEEKS - US OB TV
1 series · 13 of 28 positions shown · non-contrast
Comparison: None.

CLINICAL DATA: Vaginal bleeding, positive urine pregnancy test, LMP
02/08/2021

EXAM:
OBSTETRIC <14 WK US AND TRANSVAGINAL OB US
TECHNIQUE: Both transabdominal and transvaginal ultrasound examinations were
performed for complete evaluation of the gestation as well as the
maternal uterus, adnexal regions, and pelvic cul-de-sac.
Transvaginal technique was performed to assess early pregnancy.

[Series 1: us ob < 14 weeks - us ob tv · 0.20mm/px · 13 of 92 slices shown]
[im 4/92]
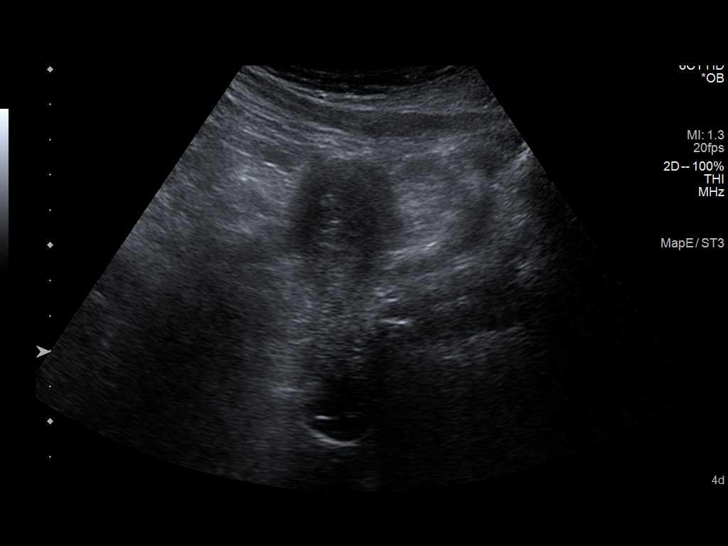
[im 11/92]
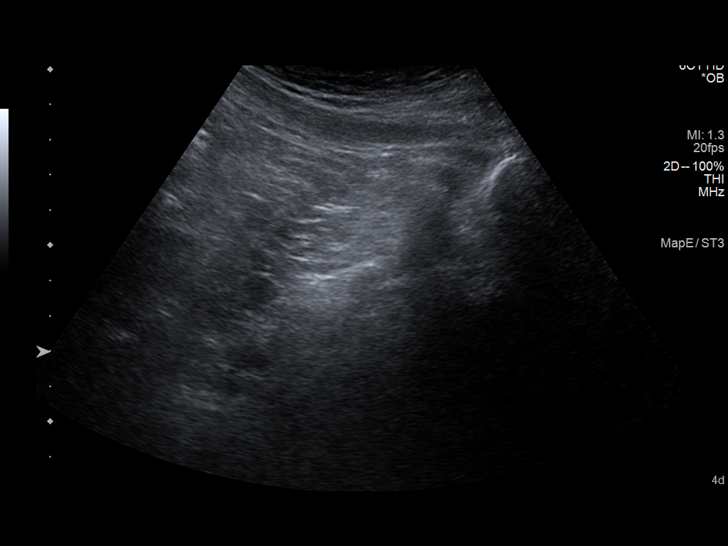
[im 17/92]
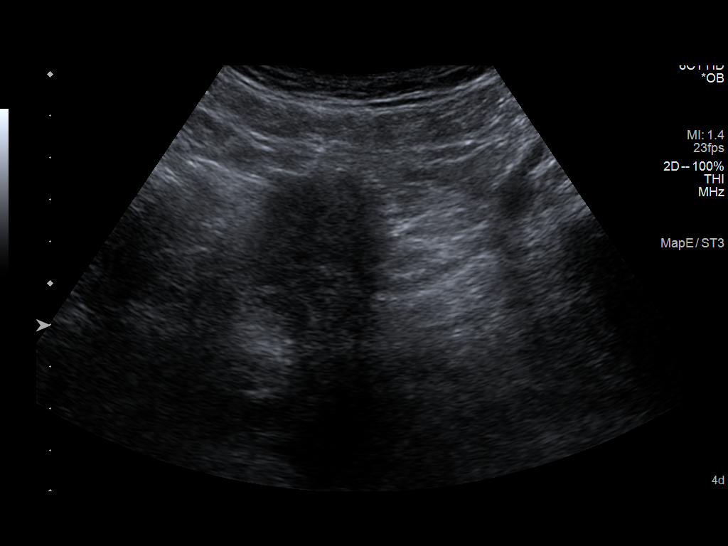
[im 24/92]
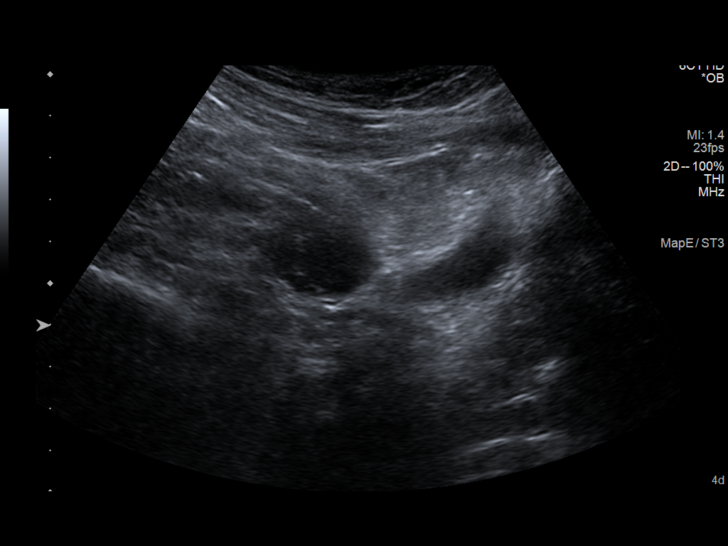
[im 31/92]
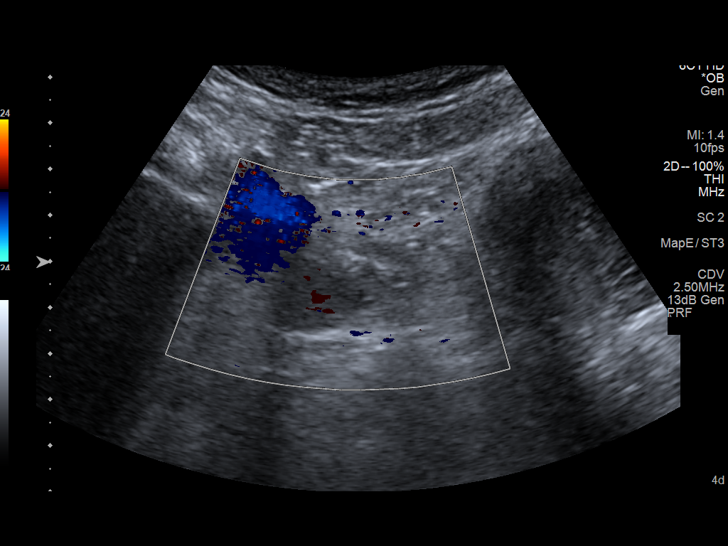
[im 38/92]
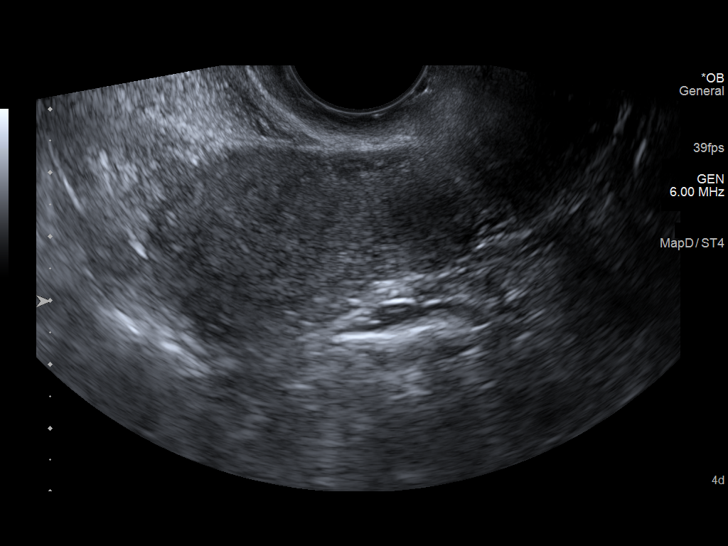
[im 48/92]
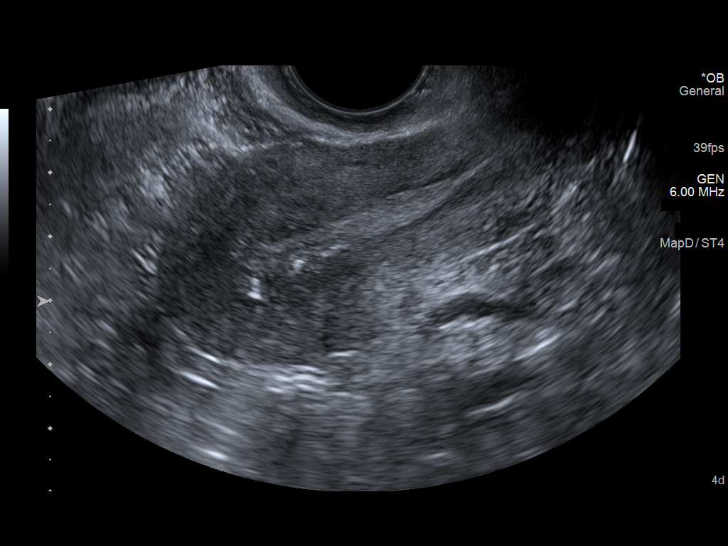
[im 54/92]
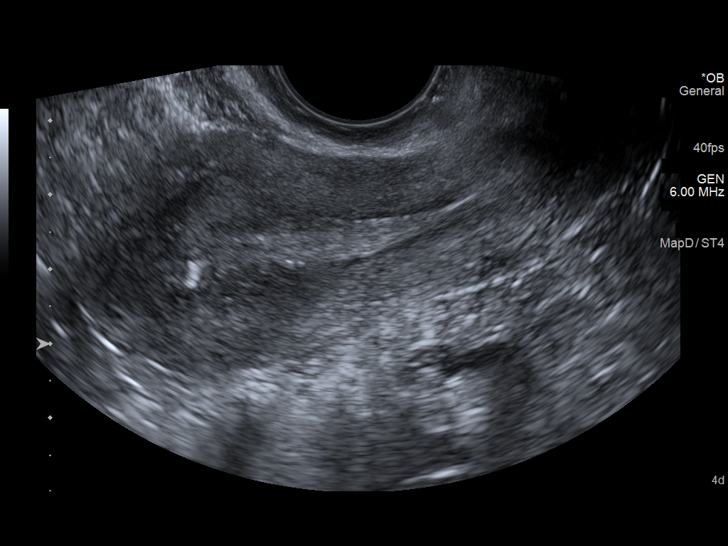
[im 61/92]
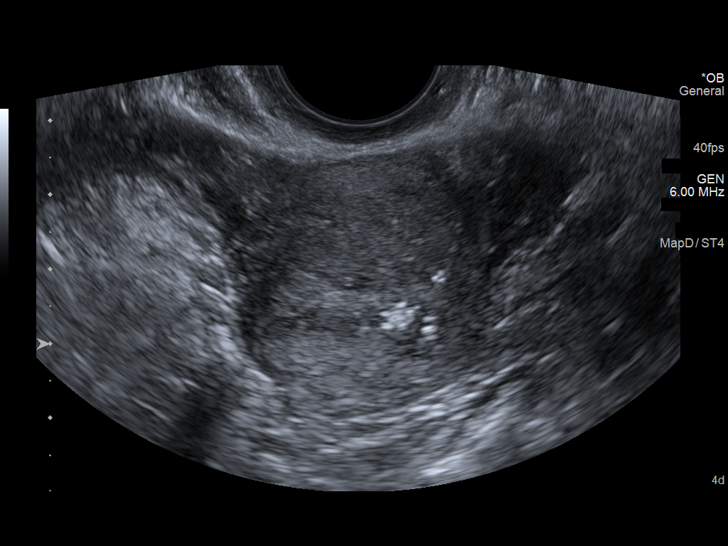
[im 68/92]
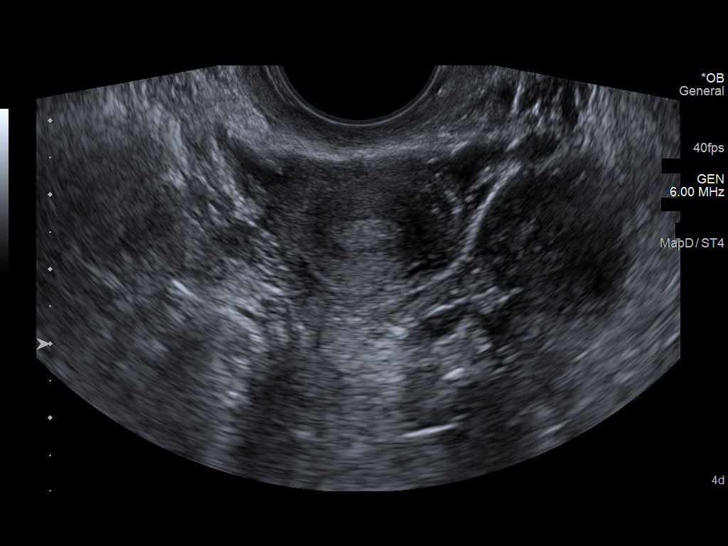
[im 75/92]
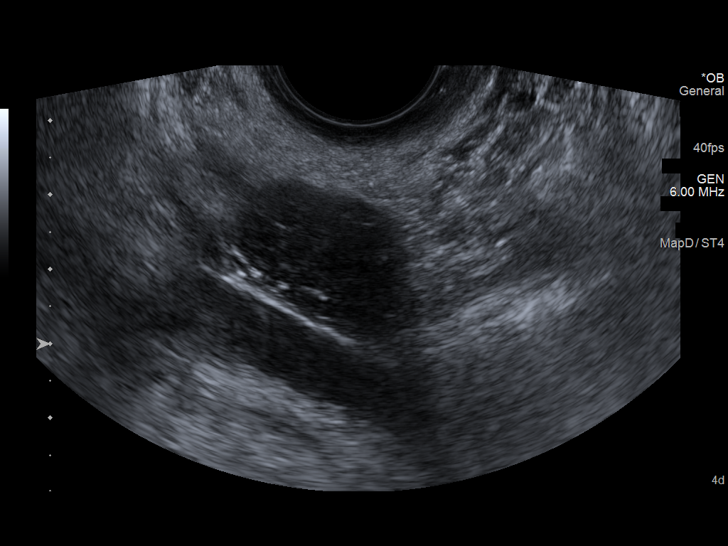
[im 81/92]
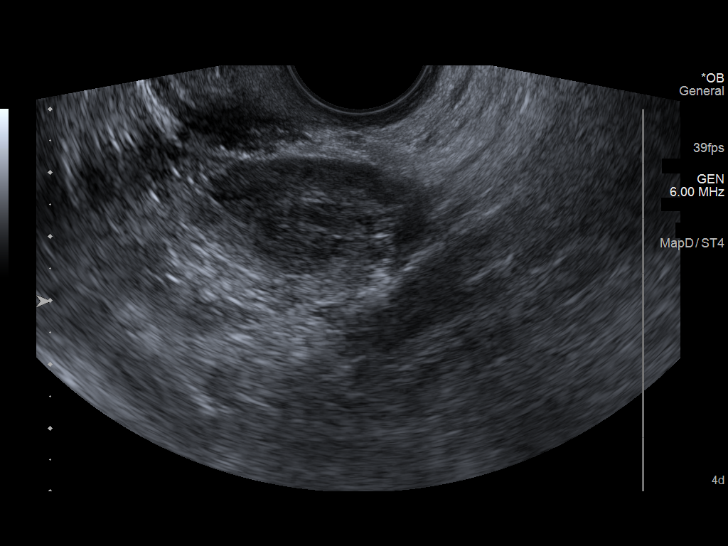
[im 88/92]
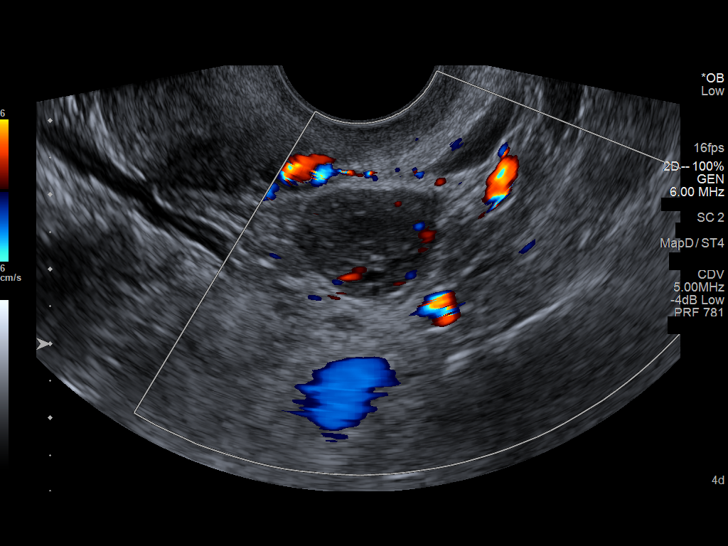

[13 of 28 positions shown; findings below may reference images not displayed]

FINDINGS: Intrauterine gestational sac: None identified

Subchorionic hemorrhage:  None visualized.

Maternal uterus/adnexae: The uterus is anteverted. The uterus
measures roughly 8.1 x 2.3 x 3.7 cm in greatest dimension. The
cervix is unremarkable. No intrauterine masses are seen. There are
multiple hyperechoic foci noted at the endometrial/myometrial
junction within the left uterine fundus in the region of the cornua
which may represent microcalcifications related to prior infection
or surgical intervention. The endometrial stripe is normal in
thickness measuring 7 mm. A 2 mm cystic structure is identified
within the a endometrial cavity within the mid body of the uterus,
indeterminate. There is no free fluid within the cul-de-sac. The
ovaries are normal in size and echogenicity. No adnexal masses are
seen.
IMPRESSION: No intrauterine gestational sac identified. Pregnancy location not
visualized sonographically. Differential diagnosis includes recent
spontaneous abortion, IUP too early to visualize, and non-visualized
ectopic pregnancy. Recommend close follow up of quantitative B-HCG
levels, and follow up US as clinically warranted.

Multiple echogenic foci within the a left uterine fundus most in
keeping with multiple microcalcifications, possibly the sequela of
remote infection or surgical intervention

2 mm cystic structure identified within the endometrial cavity,
indeterminate. This may represent a small amount of simple fluid or
an evolving cyst. This can be reassessed on subsequent sonography if
performed.

## 2022-03-29 DIAGNOSIS — Z419 Encounter for procedure for purposes other than remedying health state, unspecified: Secondary | ICD-10-CM | POA: Diagnosis not present

## 2022-04-28 DIAGNOSIS — Z419 Encounter for procedure for purposes other than remedying health state, unspecified: Secondary | ICD-10-CM | POA: Diagnosis not present

## 2022-05-29 DIAGNOSIS — Z419 Encounter for procedure for purposes other than remedying health state, unspecified: Secondary | ICD-10-CM | POA: Diagnosis not present

## 2022-06-28 DIAGNOSIS — Z419 Encounter for procedure for purposes other than remedying health state, unspecified: Secondary | ICD-10-CM | POA: Diagnosis not present

## 2022-07-29 DIAGNOSIS — Z419 Encounter for procedure for purposes other than remedying health state, unspecified: Secondary | ICD-10-CM | POA: Diagnosis not present

## 2022-08-29 DIAGNOSIS — Z419 Encounter for procedure for purposes other than remedying health state, unspecified: Secondary | ICD-10-CM | POA: Diagnosis not present

## 2022-09-28 DIAGNOSIS — Z419 Encounter for procedure for purposes other than remedying health state, unspecified: Secondary | ICD-10-CM | POA: Diagnosis not present

## 2022-10-29 DIAGNOSIS — Z419 Encounter for procedure for purposes other than remedying health state, unspecified: Secondary | ICD-10-CM | POA: Diagnosis not present

## 2023-05-29 ENCOUNTER — Ambulatory Visit
Admission: EM | Admit: 2023-05-29 | Discharge: 2023-05-29 | Disposition: A | Discharge status: Home / Self Care | Payer: Medicaid Other | Attending: Urgent Care | Admitting: Urgent Care

## 2023-05-29 DIAGNOSIS — R21 Rash and other nonspecific skin eruption: Secondary | ICD-10-CM | POA: Diagnosis not present

## 2023-05-29 MED ORDER — HYDROXYZINE HCL 25 MG PO TABS: 12.5000 mg | ORAL_TABLET | Freq: Three times a day (TID) | ORAL | 0 refills | Status: AC | PRN

## 2023-05-29 MED ORDER — PREDNISONE 20 MG PO TABS: 40.0000 mg | ORAL_TABLET | Freq: Every day | ORAL | 0 refills | Status: AC

## 2023-05-29 NOTE — ED Triage Notes (Signed)
Pt reports she she woke up yesterday she had red patches on both her arms and legs. Took benadryl but no relief.

## 2023-05-29 NOTE — ED Provider Notes (Signed)
[facility]  Note:  This document was prepared using Conservation officer, historic buildings and may include unintentional dictation errors.  MRN: [id number] DOB: 1991-02-14  Subjective:   Emily [name] is a 32 y.o. female presenting for 1 day history of urticarial rash over the forearms and thighs.  No fever, runny or stuffy nose, sore throat, cough, chest pain, shortness of breath, wheezing, nausea, vomiting, abdominal pain, facial or oral swelling.  Denies eating any new foods, starting new medications, exposure to poisonous plants, new hygiene products, new cleaning products or detergents.  She did take Benadryl but did not experience much of a difference.  No current facility-administered medications for this encounter. No current outpatient medications on file.   No Known Allergies  Past Medical History:  Diagnosis Date   Gestational diabetes      Past Surgical History:  Procedure Laterality Date   DILATION AND CURETTAGE OF UTERUS      Family History  Problem Relation Age of Onset   Diabetes Maternal Grandfather    Healthy Father    Healthy Mother     Social History   Tobacco Use   Smoking status: Former    Packs/day: 1    Types: Cigarettes   Smokeless tobacco: Never  Vaping Use   Vaping Use: Never used  Substance Use Topics   Alcohol use: Not Currently   Drug use: No    ROS   Objective:   Vitals: BP (!) 144/86 (BP Location: Right Arm)   Pulse 99   Temp 98.4 F (36.9 C) (Oral)   Resp 18   LMP 05/04/2023 (Approximate)   SpO2 96%   Physical Exam Constitutional:      General: She is not in acute distress.    Appearance: Normal appearance. She is well-developed. She is not ill-appearing, toxic-appearing or diaphoretic.  HENT:     Head: Normocephalic and atraumatic.     Right Ear: External ear normal.     Left Ear: External ear normal.     Nose: Nose normal.     Mouth/Throat:     Mouth: Mucous membranes are moist.      Comments: No facial or oral swelling.  Patient speaking in full sentences and controlling secretions. Eyes:     General: No scleral icterus.       Right eye: No discharge.        Left eye: No discharge.     Extraocular Movements: Extraocular movements intact.  Cardiovascular:     Rate and Rhythm: Normal rate.  Pulmonary:     Effort: Pulmonary effort is normal.  Skin:    General: Skin is warm and dry.     Findings: Rash (diffuse urticarial patches over the upper extremities and thighs bilaterally extending toward the flank sides) present.  Neurological:     General: No focal deficit present.     Mental Status: She is alert and oriented to person, place, and time.  Psychiatric:        Mood and Affect: Mood normal.        Behavior: Behavior normal.     Assessment and Plan :   PDMP not reviewed this encounter.  1. Rash and nonspecific skin eruption    Given the diffuse urticarial patches recommended oral prednisone course, hydroxyzine.  Recommend monitoring for and avoiding new exposures.  No signs of anaphylaxis.  Counseled patient on potential for adverse effects with medications prescribed/recommended today, ER and return-to-clinic precautions discussed, patient verbalized understanding.  Wallis Bamberg, [place] 05/29/23 1610

## 2024-04-29 ENCOUNTER — Ambulatory Visit: Payer: Self-pay

## 2024-04-29 NOTE — Telephone Encounter (Signed)
 Copied from CRM [id number]. Topic: Clinical - Red Word Triage >> Apr 29, 2024  1:41 PM [place] B wrote: [facility] that prompted transfer to Nurse Triage: pt called in has severe pain in her back, hurts when breathing in, pain at a 6   Chief Complaint: Back pain  Symptoms: Lower back pain, increased urinary frequency  Frequency: Constant  Pertinent Negatives: Patient denies burning with urination or blood in urine  Disposition: [] ED /[x] Urgent Care (no appt availability in office) / [] Appointment(In office/virtual)/ []  [facility]/ [] Home Care/ [] Refused Recommended Disposition /[] [facility]/ []  Follow-up with PCP Additional Notes: Patient reports bilateral lower back pain/flank pain that began 4 days ago. She states her pain has been constant and rates it as 6/10. She states she also has had increased urinary frequency but denies any other urinary symptoms. New patient appointment made for the patient later this month and patient instructed to go to urgent care for evaluation of her current symptoms. Patient verbalized understanding and agreement with this plan.     Reason for Disposition  [1] MODERATE back pain (e.g., interferes with normal activities) AND [2] present > 3 days  Answer Assessment - Initial Assessment Questions 1. ONSET: "When did the pain begin?"      4 days ago  2. LOCATION: "Where does it hurt?" (upper, mid or lower back)     Lower back under ribs bilaterally  3. SEVERITY: "How bad is the pain?"  (e.g., Scale 1-10; mild, moderate, or severe)   - MILD (1-3): Doesn't interfere with normal activities.    - MODERATE (4-7): Interferes with normal activities or awakens from sleep.    - SEVERE (8-10): Excruciating pain, unable to do any normal activities.      6/10 4. PATTERN: "Is the pain constant?" (e.g., yes, no; constant, intermittent)      Constant  5. RADIATION: "Does the pain shoot into your legs or somewhere else?"     No radiation  6. CAUSE:   "What do you think is causing the back pain?"      Unsure  7. BACK OVERUSE:  "Any recent lifting of heavy objects, strenuous work or exercise?"     No 8. MEDICINES: "What have you taken so far for the pain?" (e.g., nothing, acetaminophen , NSAIDS)     Ibuprofen  with no relief  9. NEUROLOGIC SYMPTOMS: "Do you have any weakness, numbness, or problems with bowel/bladder control?"     No 10. OTHER SYMPTOMS: "Do you have any other symptoms?" (e.g., fever, abdomen pain, burning with urination, blood in urine)       Increased urinary frequency  11. PREGNANCY: "Is there any chance you are pregnant?" "When was your last menstrual period?"       No  Protocols used: Back Pain-A-AH

## 2024-05-20 ENCOUNTER — Ambulatory Visit: Payer: Self-pay
